# Patient Record
Sex: Female | Born: 1985 | Race: Black or African American | Hispanic: No | Smoking: Never smoker
Health system: Southern US, Community
[De-identification: ages and names within clinical notes are randomized; demographics above are authoritative.]

## PROBLEM LIST (undated history)

## (undated) DIAGNOSIS — D649 Anemia, unspecified: Secondary | ICD-10-CM

## (undated) DIAGNOSIS — I10 Essential (primary) hypertension: Secondary | ICD-10-CM

## (undated) DIAGNOSIS — O24419 Gestational diabetes mellitus in pregnancy, unspecified control: Secondary | ICD-10-CM

## (undated) HISTORY — DX: Anemia, unspecified: D64.9

## (undated) HISTORY — PX: NO PAST SURGERIES: SHX2092

---

## 2014-12-05 LAB — OB RESULTS CONSOLE ABO/RH: RH Type: POSITIVE

## 2014-12-05 LAB — OB RESULTS CONSOLE GC/CHLAMYDIA
CHLAMYDIA, DNA PROBE: NEGATIVE
GC PROBE AMP, GENITAL: NEGATIVE

## 2014-12-05 LAB — OB RESULTS CONSOLE RPR: RPR: NONREACTIVE

## 2014-12-05 LAB — SICKLE CELL SCREEN: Sickle Cell Screen: NORMAL

## 2014-12-05 LAB — OB RESULTS CONSOLE RUBELLA ANTIBODY, IGM: Rubella: IMMUNE

## 2014-12-05 LAB — OB RESULTS CONSOLE VARICELLA ZOSTER ANTIBODY, IGG: Varicella: IMMUNE

## 2014-12-05 LAB — OB RESULTS CONSOLE HGB/HCT, BLOOD
HCT: 36 %
HEMOGLOBIN: 11.7 g/dL

## 2014-12-05 LAB — OB RESULTS CONSOLE HEPATITIS B SURFACE ANTIGEN: HEP B S AG: NEGATIVE

## 2014-12-05 LAB — CULTURE, OB URINE: Urine Culture, OB: NEGATIVE

## 2014-12-05 LAB — CYTOLOGY - PAP: PAP SMEAR: NEGATIVE

## 2014-12-05 LAB — OB RESULTS CONSOLE HIV ANTIBODY (ROUTINE TESTING): HIV: NONREACTIVE

## 2014-12-05 LAB — OB RESULTS CONSOLE ANTIBODY SCREEN: ANTIBODY SCREEN: NEGATIVE

## 2014-12-09 NOTE — L&D Delivery Note (Cosign Needed)
Delivery Note Membranes were artificially ruptured at 0023 w/ pt at approx 6cm, then with the next couple of contractions at 12:28 AM a viable female was delivered via Vaginal, Spontaneous Delivery (Presentation: Left Occiput Anterior).  APGAR: 8, 9; weight 8 lb 14.8 oz (4048 g).   Placenta status: Intact, Spontaneous.  Cord: 3 vessels   Anesthesia: Local  Episiotomy: None Lacerations: 2nd degree;Perineal Suture Repair: 0.0 and 3.0 Vicryl Est. Blood Loss (mL): 100  Dr Macon LargeAnyanwu called in to assist w/ repair as was thought initially to be a partial 3rd. She completed the repair in full including adding support to the perineal body/capsule.  Mom to postpartum.  Baby to Couplet care / Skin to Skin.   Carly Cabrera, Carly Cabrera CNM 06/27/2015, 1:29 AM

## 2015-01-05 LAB — GLUCOSE TOLERANCE, 1 HOUR (50G) W/O FASTING: Glucose, GTT - 1 Hour: 172 mg/dL (ref ?–200)

## 2015-01-06 LAB — GLUCOSE TOLERANCE, 3 HOURS
GLUCOSE 1 HOUR GTT: 162 mg/dL (ref ?–200)
Glucose, GTT - 2 Hour: 165 mg/dL — AB (ref ?–140)
Glucose, GTT - 3 Hour: 122 mg/dL (ref ?–140)
Glucose, GTT - Fasting: 94 mg/dL (ref 80–110)

## 2015-03-31 LAB — GLUCOSE TOLERANCE, 1 HOUR (50G) W/O FASTING: Glucose, GTT - 1 Hour: 225 mg/dL — AB (ref ?–200)

## 2015-04-07 ENCOUNTER — Encounter: Payer: Self-pay | Admitting: *Deleted

## 2015-04-10 ENCOUNTER — Encounter: Payer: BLUE CROSS/BLUE SHIELD | Attending: Family Medicine | Admitting: *Deleted

## 2015-04-10 ENCOUNTER — Encounter: Payer: Self-pay | Admitting: Family Medicine

## 2015-04-10 ENCOUNTER — Ambulatory Visit (INDEPENDENT_AMBULATORY_CARE_PROVIDER_SITE_OTHER): Payer: BLUE CROSS/BLUE SHIELD | Admitting: Family Medicine

## 2015-04-10 VITALS — BP 114/82 | HR 101 | Temp 98.1°F | Ht 69.0 in | Wt 208.6 lb

## 2015-04-10 DIAGNOSIS — Z23 Encounter for immunization: Secondary | ICD-10-CM

## 2015-04-10 DIAGNOSIS — O24419 Gestational diabetes mellitus in pregnancy, unspecified control: Secondary | ICD-10-CM | POA: Insufficient documentation

## 2015-04-10 DIAGNOSIS — Z713 Dietary counseling and surveillance: Secondary | ICD-10-CM | POA: Insufficient documentation

## 2015-04-10 DIAGNOSIS — O0990 Supervision of high risk pregnancy, unspecified, unspecified trimester: Secondary | ICD-10-CM | POA: Insufficient documentation

## 2015-04-10 DIAGNOSIS — O0993 Supervision of high risk pregnancy, unspecified, third trimester: Secondary | ICD-10-CM

## 2015-04-10 LAB — COMPREHENSIVE METABOLIC PANEL
ALK PHOS: 80 U/L (ref 39–117)
AST: 8 U/L (ref 0–37)
Albumin: 3.2 g/dL — ABNORMAL LOW (ref 3.5–5.2)
BUN: 5 mg/dL — ABNORMAL LOW (ref 6–23)
CHLORIDE: 105 meq/L (ref 96–112)
CO2: 22 mEq/L (ref 19–32)
Calcium: 8.7 mg/dL (ref 8.4–10.5)
Creat: 0.52 mg/dL (ref 0.50–1.10)
Glucose, Bld: 141 mg/dL — ABNORMAL HIGH (ref 70–99)
Potassium: 4.2 mEq/L (ref 3.5–5.3)
Sodium: 138 mEq/L (ref 135–145)
TOTAL PROTEIN: 6.8 g/dL (ref 6.0–8.3)
Total Bilirubin: 0.3 mg/dL (ref 0.2–1.2)

## 2015-04-10 LAB — HEMOGLOBIN A1C
Hgb A1c MFr Bld: 6.4 % — ABNORMAL HIGH (ref <5.7)
Mean Plasma Glucose: 137 mg/dL — ABNORMAL HIGH (ref <117)

## 2015-04-10 LAB — POCT URINALYSIS DIP (DEVICE)
Bilirubin Urine: NEGATIVE
Glucose, UA: 100 mg/dL — AB
HGB URINE DIPSTICK: NEGATIVE
Ketones, ur: NEGATIVE mg/dL
NITRITE: NEGATIVE
PROTEIN: NEGATIVE mg/dL
Specific Gravity, Urine: >=1.03 (ref 1.005–1.030)
Urobilinogen, UA: 0.2 mg/dL (ref 0.0–1.0)
pH: 6 (ref 5.0–8.0)

## 2015-04-10 LAB — TSH: TSH: 0.545 u[IU]/mL (ref 0.350–4.500)

## 2015-04-10 MED ORDER — ACCU-CHEK FASTCLIX LANCETS MISC: 1.0000 | Freq: Four times a day (QID) | Status: DC

## 2015-04-10 MED ORDER — GLUCOSE BLOOD VI STRP: ORAL_STRIP | Status: DC

## 2015-04-10 MED ORDER — TETANUS-DIPHTH-ACELL PERTUSSIS 5-2.5-18.5 LF-MCG/0.5 IM SUSP
0.5000 mL | Freq: Once | INTRAMUSCULAR | Status: AC
  Administered 2015-04-10: 0.5 mL via INTRAMUSCULAR

## 2015-04-10 NOTE — Progress Notes (Signed)
Nutrition note: 1st visit consult & GDM diet education Pt is a newly diagnosed GDM pt. Pt has gained 16.6# @ 28w, which is slightly > expected. Pt reports eating 3 meals & 1-2 snacks/d. Pt is taking a PNV. Pt reports no N&V or heartburn. NKFA. Pt reports walking ~20 mins 1-2 x/wk. Pt received verbal & written education about GDM diet. Encouraged ~30 mins of walking daily. Discussed wt gain goals of 15-25# or 0.6#/wk. Pt agrees to follow GDM diet with 3 meals & 1-3 snacks/d with proper CHO/ protein combination. Pt has WIC & plans to BF. F/u in 2-4 wks Carly RevealLaura Joao Mccurdy, MS, RD, LDN, [facility]CLC

## 2015-04-10 NOTE — Progress Notes (Signed)
Small leuks on Udip 

## 2015-04-10 NOTE — Progress Notes (Signed)
  Patient was seen on 04/10/15 for Gestational Diabetes self-management . The following learning objectives were met by the patient :   States the definition of Gestational Diabetes  States when to check blood glucose levels  Demonstrates proper blood glucose monitoring techniques  States the effect of stress and exercise on blood glucose levels  States the importance of limiting caffeine and abstaining from alcohol and smoking  Plan:  Consider  increasing your activity level by walking daily as tolerated Begin checking BG before breakfast and 2 hours after first bit of breakfast, lunch and dinner after  as directed by MD  Take medication  as directed by MD Blood glucose monitor given: Accu-Check Nano Lot # [id number] Exp: 12/09/15 Blood glucose reading:  148 90 mins pp Biscuit[employer]  Patient instructed to monitor glucose levels: FBS: 60 - <90 2 hour: <120  Patient received the following handouts:  Nutrition Diabetes and Pregnancy  Carbohydrate Counting List  Meal Planning worksheet  Patient will be seen for follow-up as needed.

## 2015-04-10 NOTE — Progress Notes (Signed)
[facility]  Patient is 29 y.o. [id number] 28w0.  +FM, denies LOF, VB, contractions, vaginal discharge.  Overall feeling well.  ABO, Rh: B/Positive/-- (12/28 0000) Antibody: Negative (12/28 0000) Rubella:   RPR: Nonreactive (12/28 0000)  HBsAg: Negative (12/28 0000)  HIV: Non-reactive (12/28 0000)  GBS:    Early 1 hr Glucola 172 => 3h 94/162/165/122 Genetic screening: Quad  Hx of LGA: 4.5kg (9lb 14oz) baby  Today to see DM educator, nutritionist, discussed importance of diet and tight glycemic control during pregnancy and risks of uncontrolled diabetes to infant

## 2015-04-17 ENCOUNTER — Telehealth: Payer: Self-pay | Admitting: *Deleted

## 2015-04-17 MED ORDER — GLUCOSE BLOOD VI STRP: ORAL_STRIP | Status: DC

## 2015-04-17 NOTE — Addendum Note (Signed)
Addended by: Rosendo GrosHALPIN, Lillar Bianca L on: 04/17/2015 09:39 AM   Modules accepted: Orders

## 2015-04-24 ENCOUNTER — Ambulatory Visit (INDEPENDENT_AMBULATORY_CARE_PROVIDER_SITE_OTHER): Payer: BLUE CROSS/BLUE SHIELD | Admitting: Obstetrics & Gynecology

## 2015-04-24 VITALS — BP 113/73 | HR 87 | Wt 207.3 lb

## 2015-04-24 DIAGNOSIS — O24419 Gestational diabetes mellitus in pregnancy, unspecified control: Secondary | ICD-10-CM

## 2015-04-24 LAB — POCT URINALYSIS DIP (DEVICE)
Bilirubin Urine: NEGATIVE
GLUCOSE, UA: 250 mg/dL — AB
HGB URINE DIPSTICK: NEGATIVE
Ketones, ur: NEGATIVE mg/dL
NITRITE: NEGATIVE
PROTEIN: NEGATIVE mg/dL
UROBILINOGEN UA: 1 mg/dL (ref 0.0–1.0)
pH: 6 (ref 5.0–8.0)

## 2015-04-24 MED ORDER — GLYBURIDE 2.5 MG PO TABS: 2.5000 mg | ORAL_TABLET | Freq: Every day | ORAL | Status: DC

## 2015-04-24 NOTE — Patient Instructions (Signed)
Return to clinic for any obstetric concerns or go to [facility] for evaluation  

## 2015-04-24 NOTE — Progress Notes (Signed)
On review of blood sugars, fasting and PP breakfast are within control.  PP lunch 119-157, PP dinner 120-194 Will start Glyburide 2.5 mg po qam, reevaluate in one week. Will start 2x/week testing at 32 weeks, and delivery at 39 weeks. No other complaints or concerns.  Labor and fetal movement precautions reviewed.

## 2015-04-26 NOTE — Telephone Encounter (Signed)
Patient noted hat pharmacy did not have RX for test strips. Confirmed that pharmacy did not have the RX. Sent in again.

## 2015-05-01 ENCOUNTER — Ambulatory Visit (INDEPENDENT_AMBULATORY_CARE_PROVIDER_SITE_OTHER): Payer: BLUE CROSS/BLUE SHIELD | Admitting: Family Medicine

## 2015-05-01 VITALS — BP 123/76 | HR 94 | Temp 98.8°F | Wt 207.7 lb

## 2015-05-01 DIAGNOSIS — O24419 Gestational diabetes mellitus in pregnancy, unspecified control: Secondary | ICD-10-CM

## 2015-05-01 LAB — POCT URINALYSIS DIP (DEVICE)
Bilirubin Urine: NEGATIVE
Glucose, UA: NEGATIVE mg/dL
Hgb urine dipstick: NEGATIVE
Ketones, ur: NEGATIVE mg/dL
Nitrite: NEGATIVE
Protein, ur: 30 mg/dL — AB
Specific Gravity, Urine: 1.025 (ref 1.005–1.030)
Urobilinogen, UA: 1 mg/dL (ref 0.0–1.0)
pH: 7 (ref 5.0–8.0)

## 2015-05-01 MED ORDER — GLYBURIDE 2.5 MG PO TABS: 5.0000 mg | ORAL_TABLET | Freq: Every day | ORAL | Status: DC

## 2015-05-01 NOTE — Patient Instructions (Signed)

## 2015-05-01 NOTE — Progress Notes (Signed)
Has been taking glyburide after breakfast.  No problems with medication - no side effects.  Denies hypoglycemic episodes.   CBGs: a couple elevated fasting.  Many afternoon and evening CBGs in the 140s to 160s.  Increase glyburide to 5mg  in AM.   Has pain on moderate intermittent left low back.  Nonradiating.  Back exercises given.

## 2015-05-01 NOTE — Progress Notes (Signed)
Pt reports back pain. Large Leukocytes in urine.

## 2015-05-09 ENCOUNTER — Ambulatory Visit (INDEPENDENT_AMBULATORY_CARE_PROVIDER_SITE_OTHER): Payer: BLUE CROSS/BLUE SHIELD | Admitting: Obstetrics & Gynecology

## 2015-05-09 ENCOUNTER — Encounter: Payer: Self-pay | Admitting: Obstetrics & Gynecology

## 2015-05-09 VITALS — BP 105/67 | HR 94 | Wt 213.3 lb

## 2015-05-09 DIAGNOSIS — O24419 Gestational diabetes mellitus in pregnancy, unspecified control: Secondary | ICD-10-CM | POA: Diagnosis not present

## 2015-05-09 DIAGNOSIS — O0993 Supervision of high risk pregnancy, unspecified, third trimester: Secondary | ICD-10-CM

## 2015-05-09 LAB — POCT URINALYSIS DIP (DEVICE)
Bilirubin Urine: NEGATIVE
Glucose, UA: 250 mg/dL — AB
Hgb urine dipstick: NEGATIVE
Ketones, ur: NEGATIVE mg/dL
Nitrite: NEGATIVE
Protein, ur: 30 mg/dL — AB
Specific Gravity, Urine: >=1.03 (ref 1.005–1.030)
UROBILINOGEN UA: 2 mg/dL — AB (ref 0.0–1.0)
pH: 7 (ref 5.0–8.0)

## 2015-05-09 MED ORDER — GLYBURIDE 2.5 MG PO TABS: 5.0000 mg | ORAL_TABLET | Freq: Every day | ORAL | Status: DC

## 2015-05-09 NOTE — Progress Notes (Signed)
FBS 70-113, 3 values >range, pp 112-158, 176  Has increased to 5 mg glyburide in AM, will add 2.5 mg PM  NST reactive

## 2015-05-09 NOTE — Progress Notes (Signed)
Small leuks on udip.  Twice weekly fetal testing initiated today.

## 2015-05-09 NOTE — Patient Instructions (Signed)
Third Trimester of Pregnancy The third trimester is from week 29 through week 42, months 7 through 9. The third trimester is a time when the fetus is growing rapidly. At the end of the ninth month, the fetus is about 20 inches in length and weighs 6-10 pounds.  BODY CHANGES Your body goes through many changes during pregnancy. The changes vary from woman to woman.   Your weight will continue to increase. You can expect to gain 25-35 pounds (11-16 kg) by the end of the pregnancy.  You may begin to get stretch marks on your hips, abdomen, and breasts.  You may urinate more often because the fetus is moving lower into your pelvis and pressing on your bladder.  You may develop or continue to have heartburn as a result of your pregnancy.  You may develop constipation because certain hormones are causing the muscles that push waste through your intestines to slow down.  You may develop hemorrhoids or swollen, bulging veins (varicose veins).  You may have pelvic pain because of the weight gain and pregnancy hormones relaxing your joints between the bones in your pelvis. Backaches may result from overexertion of the muscles supporting your posture.  You may have changes in your hair. These can include thickening of your hair, rapid growth, and changes in texture. Some women also have hair loss during or after pregnancy, or hair that feels dry or thin. Your hair will most likely return to normal after your baby is born.  Your breasts will continue to grow and be tender. A yellow discharge may leak from your breasts called colostrum.  Your belly button may stick out.  You may feel short of breath because of your expanding uterus.  You may notice the fetus "dropping," or moving lower in your abdomen.  You may have a bloody mucus discharge. This usually occurs a few days to a week before labor begins.  Your cervix becomes thin and soft (effaced) near your due date. WHAT TO EXPECT AT YOUR PRENATAL  EXAMS  You will have prenatal exams every 2 weeks until week 36. Then, you will have weekly prenatal exams. During a routine prenatal visit:  You will be weighed to make sure you and the fetus are growing normally.  Your blood pressure is taken.  Your abdomen will be measured to track your baby's growth.  The fetal heartbeat will be listened to.  Any test results from the previous visit will be discussed.  You may have a cervical check near your due date to see if you have effaced. At around 36 weeks, your caregiver will check your cervix. At the same time, your caregiver will also perform a test on the secretions of the vaginal tissue. This test is to determine if a type of bacteria, Group B streptococcus, is present. Your caregiver will explain this further. Your caregiver may ask you:  What your birth plan is.  How you are feeling.  If you are feeling the baby move.  If you have had any abnormal symptoms, such as leaking fluid, bleeding, severe headaches, or abdominal cramping.  If you have any questions. Other tests or screenings that may be performed during your third trimester include:  Blood tests that check for low iron levels (anemia).  Fetal testing to check the health, activity level, and growth of the fetus. Testing is done if you have certain medical conditions or if there are problems during the pregnancy. FALSE LABOR You may feel small, irregular contractions that   eventually go away. These are called Braxton Hicks contractions, or false labor. Contractions may last for hours, days, or even weeks before true labor sets in. If contractions come at regular intervals, intensify, or become painful, it is best to be seen by your caregiver.  SIGNS OF LABOR   Menstrual-like cramps.  Contractions that are 5 minutes apart or less.  Contractions that start on the top of the uterus and spread down to the lower abdomen and back.  A sense of increased pelvic pressure or back  pain.  A watery or bloody mucus discharge that comes from the vagina. If you have any of these signs before the 37th week of pregnancy, call your caregiver right away. You need to go to the hospital to get checked immediately. HOME CARE INSTRUCTIONS   Avoid all smoking, herbs, alcohol, and unprescribed drugs. These chemicals affect the formation and growth of the baby.  Follow your caregiver's instructions regarding medicine use. There are medicines that are either safe or unsafe to take during pregnancy.  Exercise only as directed by your caregiver. Experiencing uterine cramps is a good sign to stop exercising.  Continue to eat regular, healthy meals.  Wear a good support bra for breast tenderness.  Do not use hot tubs, steam rooms, or saunas.  Wear your seat belt at all times when driving.  Avoid raw meat, uncooked cheese, cat litter boxes, and soil used by cats. These carry germs that can cause birth defects in the baby.  Take your prenatal vitamins.  Try taking a stool softener (if your caregiver approves) if you develop constipation. Eat more high-fiber foods, such as fresh vegetables or fruit and whole grains. Drink plenty of fluids to keep your urine clear or pale yellow.  Take warm sitz baths to soothe any pain or discomfort caused by hemorrhoids. Use hemorrhoid cream if your caregiver approves.  If you develop varicose veins, wear support hose. Elevate your feet for 15 minutes, 3-4 times a day. Limit salt in your diet.  Avoid heavy lifting, wear low heal shoes, and practice good posture.  Rest a lot with your legs elevated if you have leg cramps or low back pain.  Visit your dentist if you have not gone during your pregnancy. Use a soft toothbrush to brush your teeth and be gentle when you floss.  A sexual relationship may be continued unless your caregiver directs you otherwise.  Do not travel far distances unless it is absolutely necessary and only with the approval  of your caregiver.  Take prenatal classes to understand, practice, and ask questions about the labor and delivery.  Make a trial run to the hospital.  Pack your hospital bag.  Prepare the baby's nursery.  Continue to go to all your prenatal visits as directed by your caregiver. SEEK MEDICAL CARE IF:  You are unsure if you are in labor or if your water has broken.  You have dizziness.  You have mild pelvic cramps, pelvic pressure, or nagging pain in your abdominal area.  You have persistent nausea, vomiting, or diarrhea.  You have a bad smelling vaginal discharge.  You have pain with urination. SEEK IMMEDIATE MEDICAL CARE IF:   You have a fever.  You are leaking fluid from your vagina.  You have spotting or bleeding from your vagina.  You have severe abdominal cramping or pain.  You have rapid weight loss or gain.  You have shortness of breath with chest pain.  You notice sudden or extreme swelling   of your face, hands, ankles, feet, or legs.  You have not felt your baby move in over an hour.  You have severe headaches that do not go away with medicine.  You have vision changes. Document Released: 11/19/2001 Document Revised: 11/30/2013 Document Reviewed: 01/26/2013 Exit[employer] Patient Information 2015 [employer]. This information is not intended to replace advice given to you by your health care provider. Make sure you discuss any questions you have with your health care provider.  

## 2015-05-12 ENCOUNTER — Ambulatory Visit (INDEPENDENT_AMBULATORY_CARE_PROVIDER_SITE_OTHER): Payer: BLUE CROSS/BLUE SHIELD | Admitting: *Deleted

## 2015-05-12 VITALS — BP 104/64 | HR 90

## 2015-05-12 DIAGNOSIS — O24419 Gestational diabetes mellitus in pregnancy, unspecified control: Secondary | ICD-10-CM | POA: Diagnosis not present

## 2015-05-12 LAB — POCT URINALYSIS DIP (DEVICE)
Bilirubin Urine: NEGATIVE
GLUCOSE, UA: 500 mg/dL — AB
Hgb urine dipstick: NEGATIVE
LEUKOCYTES UA: NEGATIVE
Nitrite: NEGATIVE
Protein, ur: NEGATIVE mg/dL
Specific Gravity, Urine: 1.02 (ref 1.005–1.030)
Urobilinogen, UA: 0.2 mg/dL (ref 0.0–1.0)
pH: 6 (ref 5.0–8.0)

## 2015-05-12 NOTE — Progress Notes (Signed)
NST reactive.

## 2015-05-12 NOTE — Progress Notes (Signed)
Pt c/o itching and pain on inside of thighs.

## 2015-05-15 ENCOUNTER — Ambulatory Visit (INDEPENDENT_AMBULATORY_CARE_PROVIDER_SITE_OTHER): Payer: BLUE CROSS/BLUE SHIELD | Admitting: Family Medicine

## 2015-05-15 VITALS — BP 123/78 | HR 100 | Wt 211.8 lb

## 2015-05-15 DIAGNOSIS — O24419 Gestational diabetes mellitus in pregnancy, unspecified control: Secondary | ICD-10-CM

## 2015-05-15 DIAGNOSIS — O0993 Supervision of high risk pregnancy, unspecified, third trimester: Secondary | ICD-10-CM

## 2015-05-15 LAB — POCT URINALYSIS DIP (DEVICE)
Bilirubin Urine: NEGATIVE
Glucose, UA: 500 mg/dL — AB
NITRITE: NEGATIVE
Protein, ur: NEGATIVE mg/dL
Specific Gravity, Urine: 1.025 (ref 1.005–1.030)
Urobilinogen, UA: 0.2 mg/dL (ref 0.0–1.0)
pH: 5.5 (ref 5.0–8.0)

## 2015-05-15 MED ORDER — GLYBURIDE 5 MG PO TABS: 5.0000 mg | ORAL_TABLET | Freq: Every day | ORAL | Status: DC

## 2015-05-15 MED ORDER — NYSTATIN 100000 UNIT/GM EX CREA
1.0000 | TOPICAL_CREAM | Freq: Two times a day (BID) | CUTANEOUS | Status: DC
Start: 2015-05-15 — End: 2015-05-29

## 2015-05-15 NOTE — Progress Notes (Signed)
Pt reports rash on thighs and groin, very itchy.

## 2015-05-15 NOTE — Patient Instructions (Signed)
Third Trimester of Pregnancy The third trimester is from week 29 through week 42, months 7 through 9. The third trimester is a time when the fetus is growing rapidly. At the end of the ninth month, the fetus is about 20 inches in length and weighs 6-10 pounds.  BODY CHANGES Your body goes through many changes during pregnancy. The changes vary from woman to woman.   Your weight will continue to increase. You can expect to gain 25-35 pounds (11-16 kg) by the end of the pregnancy.  You may begin to get stretch marks on your hips, abdomen, and breasts.  You may urinate more often because the fetus is moving lower into your pelvis and pressing on your bladder.  You may develop or continue to have heartburn as a result of your pregnancy.  You may develop constipation because certain hormones are causing the muscles that push waste through your intestines to slow down.  You may develop hemorrhoids or swollen, bulging veins (varicose veins).  You may have pelvic pain because of the weight gain and pregnancy hormones relaxing your joints between the bones in your pelvis. Backaches may result from overexertion of the muscles supporting your posture.  You may have changes in your hair. These can include thickening of your hair, rapid growth, and changes in texture. Some women also have hair loss during or after pregnancy, or hair that feels dry or thin. Your hair will most likely return to normal after your baby is born.  Your breasts will continue to grow and be tender. A yellow discharge may leak from your breasts called colostrum.  Your belly button may stick out.  You may feel short of breath because of your expanding uterus.  You may notice the fetus "dropping," or moving lower in your abdomen.  You may have a bloody mucus discharge. This usually occurs a few days to a week before labor begins.  Your cervix becomes thin and soft (effaced) near your due date. WHAT TO EXPECT AT YOUR PRENATAL  EXAMS  You will have prenatal exams every 2 weeks until week 36. Then, you will have weekly prenatal exams. During a routine prenatal visit:  You will be weighed to make sure you and the fetus are growing normally.  Your blood pressure is taken.  Your abdomen will be measured to track your baby's growth.  The fetal heartbeat will be listened to.  Any test results from the previous visit will be discussed.  You may have a cervical check near your due date to see if you have effaced. At around 36 weeks, your caregiver will check your cervix. At the same time, your caregiver will also perform a test on the secretions of the vaginal tissue. This test is to determine if a type of bacteria, Group B streptococcus, is present. Your caregiver will explain this further. Your caregiver may ask you:  What your birth plan is.  How you are feeling.  If you are feeling the baby move.  If you have had any abnormal symptoms, such as leaking fluid, bleeding, severe headaches, or abdominal cramping.  If you have any questions. Other tests or screenings that may be performed during your third trimester include:  Blood tests that check for low iron levels (anemia).  Fetal testing to check the health, activity level, and growth of the fetus. Testing is done if you have certain medical conditions or if there are problems during the pregnancy. FALSE LABOR You may feel small, irregular contractions that   eventually go away. These are called Braxton Hicks contractions, or false labor. Contractions may last for hours, days, or even weeks before true labor sets in. If contractions come at regular intervals, intensify, or become painful, it is best to be seen by your caregiver.  SIGNS OF LABOR   Menstrual-like cramps.  Contractions that are 5 minutes apart or less.  Contractions that start on the top of the uterus and spread down to the lower abdomen and back.  A sense of increased pelvic pressure or back  pain.  A watery or bloody mucus discharge that comes from the vagina. If you have any of these signs before the 37th week of pregnancy, call your caregiver right away. You need to go to the hospital to get checked immediately. HOME CARE INSTRUCTIONS   Avoid all smoking, herbs, alcohol, and unprescribed drugs. These chemicals affect the formation and growth of the baby.  Follow your caregiver's instructions regarding medicine use. There are medicines that are either safe or unsafe to take during pregnancy.  Exercise only as directed by your caregiver. Experiencing uterine cramps is a good sign to stop exercising.  Continue to eat regular, healthy meals.  Wear a good support bra for breast tenderness.  Do not use hot tubs, steam rooms, or saunas.  Wear your seat belt at all times when driving.  Avoid raw meat, uncooked cheese, cat litter boxes, and soil used by cats. These carry germs that can cause birth defects in the baby.  Take your prenatal vitamins.  Try taking a stool softener (if your caregiver approves) if you develop constipation. Eat more high-fiber foods, such as fresh vegetables or fruit and whole grains. Drink plenty of fluids to keep your urine clear or pale yellow.  Take warm sitz baths to soothe any pain or discomfort caused by hemorrhoids. Use hemorrhoid cream if your caregiver approves.  If you develop varicose veins, wear support hose. Elevate your feet for 15 minutes, 3-4 times a day. Limit salt in your diet.  Avoid heavy lifting, wear low heal shoes, and practice good posture.  Rest a lot with your legs elevated if you have leg cramps or low back pain.  Visit your dentist if you have not gone during your pregnancy. Use a soft toothbrush to brush your teeth and be gentle when you floss.  A sexual relationship may be continued unless your caregiver directs you otherwise.  Do not travel far distances unless it is absolutely necessary and only with the approval  of your caregiver.  Take prenatal classes to understand, practice, and ask questions about the labor and delivery.  Make a trial run to the hospital.  Pack your hospital bag.  Prepare the baby's nursery.  Continue to go to all your prenatal visits as directed by your caregiver. SEEK MEDICAL CARE IF:  You are unsure if you are in labor or if your water has broken.  You have dizziness.  You have mild pelvic cramps, pelvic pressure, or nagging pain in your abdominal area.  You have persistent nausea, vomiting, or diarrhea.  You have a bad smelling vaginal discharge.  You have pain with urination. SEEK IMMEDIATE MEDICAL CARE IF:   You have a fever.  You are leaking fluid from your vagina.  You have spotting or bleeding from your vagina.  You have severe abdominal cramping or pain.  You have rapid weight loss or gain.  You have shortness of breath with chest pain.  You notice sudden or extreme swelling   of your face, hands, ankles, feet, or legs.  You have not felt your baby move in over an hour.  You have severe headaches that do not go away with medicine.  You have vision changes. Document Released: 11/19/2001 Document Revised: 11/30/2013 Document Reviewed: 01/26/2013 Exit[employer] Patient Information 2015 [employer]. This information is not intended to replace advice given to you by your health care provider. Make sure you discuss any questions you have with your health care provider.  

## 2015-05-15 NOTE — Progress Notes (Signed)
Subjective:   Carly Cabrera is a 29 y.o. [phone] at 151w0d being seen today for her obstetrical visit.  Patient reports rash.   Contractions: Contractions: Not present.   Vaginal Bleeding Vag. Bleeding: None.   Fetal Movement: Movement: Present.  Reports good fetal movement.  Blood sugars: Fasting 85-141, 6/7 elevated.  2hr PP: 78-176, 14/21 elevated. Has been taking Glyburide 5mg  in AM and 2.5mg  in PM. No hypoglycemic episodes  Worsening rash on inner thighs - painful and itching.  No palliating or provoking factors.  Hasn't tried to apply anything.  The following portions of the patient's history were reviewed and updated as appropriate: allergies, current medications, past family history, past medical history, past social history, past surgical history and problem list.   Objective:  BP 123/78 mmHg  Pulse 100  Wt 211 lb 12.8 oz (96.072 kg)  LMP 09/26/2014 (Exact Date) Fetal Heart Rate: Fetal Heart Rate (bpm): 148  Fundal Height:    Fetal Movement: Movement: Present  Fetal Presentation:  vertex  Skin: erythemic, raised intertriginous rash on inner thighs and extending to vulva bilaterally. Abdomen: Soft, gravid, appropriate for gestational age.  Pain/Pressure: Pain/Pressure: Present     Extremities: Edema: Edema: None   Urinalysis: Protein:   Glucose:   Results for orders placed or performed in visit on 05/15/15 (from the past 24 hour(s))  POCT urinalysis dip (device)     Status: Abnormal   Collection Time: 05/15/15  9:00 AM  Result Value Ref Range   Glucose, UA 500 (A) NEGATIVE mg/dL   Bilirubin Urine NEGATIVE NEGATIVE   Ketones, ur TRACE (A) NEGATIVE mg/dL   Specific Gravity, Urine 1.025 1.005 - 1.030   Hgb urine dipstick TRACE (A) NEGATIVE   pH 5.5 5.0 - 8.0   Protein, ur NEGATIVE NEGATIVE mg/dL   Urobilinogen, UA 0.2 0.0 - 1.0 mg/dL   Nitrite NEGATIVE NEGATIVE   Leukocytes, UA SMALL (A) NEGATIVE     Assessment and Plan:   Pregnancy:  G4P3003 at 3551w0d  1.  Supervision of high risk pregnancy, antepartum, third trimester Normal fundal height.   2. A2 Gestational diabetes mellitus, antepartum Continue NST. NST reactive today. Continue CBGs 4x daily Not controlled yet. Increase glyburide to 10mg  in AM and 5mg  in PM.  3.  Intertrigo Candidal.  Nystatin cream prescribed  Preterm labor symptoms: vaginal bleeding, contractions and leaking of fluid reviewed in detail.  Fetal movement precautions reviewed.    Levie HeritageJacob J Stinson, DO

## 2015-05-19 ENCOUNTER — Ambulatory Visit (INDEPENDENT_AMBULATORY_CARE_PROVIDER_SITE_OTHER): Payer: BLUE CROSS/BLUE SHIELD | Admitting: *Deleted

## 2015-05-19 VITALS — BP 105/73 | HR 119

## 2015-05-19 DIAGNOSIS — O24419 Gestational diabetes mellitus in pregnancy, unspecified control: Secondary | ICD-10-CM

## 2015-05-22 ENCOUNTER — Ambulatory Visit (INDEPENDENT_AMBULATORY_CARE_PROVIDER_SITE_OTHER): Payer: BLUE CROSS/BLUE SHIELD | Admitting: Obstetrics & Gynecology

## 2015-05-22 VITALS — BP 117/72 | HR 88 | Temp 97.6°F | Wt 212.9 lb

## 2015-05-22 DIAGNOSIS — O0993 Supervision of high risk pregnancy, unspecified, third trimester: Secondary | ICD-10-CM

## 2015-05-22 DIAGNOSIS — O24419 Gestational diabetes mellitus in pregnancy, unspecified control: Secondary | ICD-10-CM

## 2015-05-22 MED ORDER — GLYBURIDE 5 MG PO TABS: ORAL_TABLET | ORAL | Status: DC

## 2015-05-22 NOTE — Progress Notes (Signed)
Subjective:  Carly Cabrera is a 29 y.o. [phone] at [redacted]w[redacted]d being seen today for ongoing prenatal care.  Patient reports no complaints.  Contractions: Not present.  Vag. Bleeding: None. Movement: Present. Denies leaking of fluid.   The following portions of the patient's history were reviewed and updated as appropriate: allergies, current medications, past family history, past medical history, past social history, past surgical history and problem list.   Objective:   Filed Vitals:   05/22/15 1022  BP: 117/72  Pulse: 88  Temp: 97.6 F (36.4 C)  Weight: 212 lb 14.4 oz (96.571 kg)    Fetal Status: Fetal Heart Rate (bpm): 143 Fundal Height: 35 cm Movement: Present     General:  Alert, oriented and cooperative. Patient is in no acute distress.  Skin: Skin is warm and dry. No rash noted.   Cardiovascular: Normal heart rate noted  Respiratory: Effort and breath sounds normal, no problems with respiration noted  Abdomen: Soft, gravid, appropriate for gestational age. Pain/Pressure: Present     Vaginal: Vag. Bleeding: None.       Cervix: Not evaluated  Extremities: Normal range of motion.  Edema: None  Mental Status: Normal mood and affect. Normal behavior. Normal judgment and thought content.   Urinalysis:      Blood sugars:  Fasting 105/93/105/74/124/74/100/74.  All 2 hr PP are within normal range  Assessment and Plan:  Pregnancy: G4P3003 at 101w0d  1. A2 Gestational diabetes mellitus, antepartum Changed GlyBURIDE (DIABETA) 5 MG tablet; 2 tablets in AM and 1.5 tablet in QHS (was one tablet before). Will reevaluate later this week, may consider addition of Metformin if needed. NST performed today was reviewed and was found to be reactive.  Continue recommended antenatal testing and prenatal care.  2. Supervision of high risk pregnancy, antepartum, third trimester Preterm labor symptoms and general obstetric precautions including but not limited to vaginal bleeding, contractions,  leaking of fluid and fetal movement were reviewed in detail with the patient. Please refer to After Visit Summary for other counseling recommendations.   No Follow-up on file.   Tereso Newcomer, MD

## 2015-05-22 NOTE — Patient Instructions (Signed)
Return to clinic for any obstetric concerns or go to [facility] for evaluation  

## 2015-05-22 NOTE — Progress Notes (Signed)
Discussed breastfeeding tip of the week.  Has not given urine sample yet.

## 2015-05-25 ENCOUNTER — Ambulatory Visit (INDEPENDENT_AMBULATORY_CARE_PROVIDER_SITE_OTHER): Payer: BLUE CROSS/BLUE SHIELD | Admitting: *Deleted

## 2015-05-25 ENCOUNTER — Ambulatory Visit (HOSPITAL_COMMUNITY)
Admission: RE | Admit: 2015-05-25 | Discharge: 2015-05-25 | Disposition: A | Discharge status: Home / Self Care | Payer: BLUE CROSS/BLUE SHIELD | Source: Ambulatory Visit | Attending: Obstetrics & Gynecology | Admitting: Obstetrics & Gynecology

## 2015-05-25 VITALS — BP 106/68 | HR 89

## 2015-05-25 DIAGNOSIS — O24419 Gestational diabetes mellitus in pregnancy, unspecified control: Secondary | ICD-10-CM | POA: Insufficient documentation

## 2015-05-25 DIAGNOSIS — O283 Abnormal ultrasonic finding on antenatal screening of mother: Secondary | ICD-10-CM | POA: Diagnosis present

## 2015-05-25 DIAGNOSIS — Z79899 Other long term (current) drug therapy: Secondary | ICD-10-CM | POA: Diagnosis not present

## 2015-05-25 DIAGNOSIS — Z3A34 34 weeks gestation of pregnancy: Secondary | ICD-10-CM | POA: Insufficient documentation

## 2015-05-25 NOTE — Progress Notes (Signed)
NST performed today was reviewed and was found to be reactive.  Continue recommended antenatal testing and prenatal care.  

## 2015-05-25 NOTE — Progress Notes (Signed)
BPP today due to NR NST - Dr. Macon Large notified.

## 2015-05-29 ENCOUNTER — Ambulatory Visit (INDEPENDENT_AMBULATORY_CARE_PROVIDER_SITE_OTHER): Payer: BLUE CROSS/BLUE SHIELD | Admitting: Obstetrics & Gynecology

## 2015-05-29 VITALS — BP 106/63 | HR 96 | Wt 216.8 lb

## 2015-05-29 DIAGNOSIS — O24419 Gestational diabetes mellitus in pregnancy, unspecified control: Secondary | ICD-10-CM | POA: Diagnosis not present

## 2015-05-29 LAB — POCT URINALYSIS DIP (DEVICE)
Bilirubin Urine: NEGATIVE
Glucose, UA: >=1000 mg/dL — AB
Hgb urine dipstick: NEGATIVE
Nitrite: NEGATIVE
PH: 6 (ref 5.0–8.0)
PROTEIN: NEGATIVE mg/dL
Specific Gravity, Urine: 1.02 (ref 1.005–1.030)
UROBILINOGEN UA: 0.2 mg/dL (ref 0.0–1.0)

## 2015-05-29 NOTE — Progress Notes (Signed)
Subjective: NST today  Carly Cabrera is a 29 y.o. [phone] at [redacted]w[redacted]d being seen today for ongoing prenatal care.  Patient reports no complaints.  Contractions: Not present.  Vag. Bleeding: None. Movement: Present. Denies leaking of fluid.  BPP 8/8 last week The following portions of the patient's history were reviewed and updated as appropriate: allergies, current medications, past family history, past medical history, past social history, past surgical history and problem list.   Objective:NAD   Filed Vitals:   05/29/15 0802  BP: 106/63  Pulse: 96  Weight: 216 lb 12.8 oz (98.34 kg)    Fetal Status: Fetal Heart Rate (bpm): NST Fundal Height: 36 cm Movement: Present     General:  Alert, oriented and cooperative. Patient is in no acute distress.  Skin: Skin is warm and dry. No rash noted.   Cardiovascular: Normal heart rate noted  Respiratory:  no problems with respiration noted  Abdomen: Soft, gravid, appropriate for gestational age. Pain/Pressure: Absent     Vaginal: Vag. Bleeding: None.       Cervix: Not evaluated       Extremities: Normal range of motion.  Edema: None  Mental Status: Normal mood and affect. Normal behavior. Normal judgment and thought content.   Urinalysis: Urine Protein: Negative Urine Glucose: 4+  Assessment and Plan:  Pregnancy: G4P3003 at [redacted]w[redacted]d  1. Gestational diabetes mellitus in third trimester, unspecified diabetic control FBS most in range, 102 highest, PP in range - Fetal nonstress test  2. A2 Gestational diabetes mellitus, antepartum Acceptable control   Preterm labor symptoms and general obstetric precautions including but not limited to vaginal bleeding, contractions, leaking of fluid and fetal movement were reviewed in detail with the patient.  Please refer to After Visit Summary for other counseling recommendations.   Return in about 7 days (around 06/05/2015) for Ob f/u & NST.   Adam Phenix, MD 05/29/2015

## 2015-05-29 NOTE — Progress Notes (Signed)
Reviewed tip of week with patient  

## 2015-05-31 ENCOUNTER — Ambulatory Visit (HOSPITAL_COMMUNITY): Payer: BLUE CROSS/BLUE SHIELD

## 2015-06-01 ENCOUNTER — Other Ambulatory Visit: Payer: Self-pay | Admitting: Obstetrics & Gynecology

## 2015-06-01 ENCOUNTER — Ambulatory Visit (INDEPENDENT_AMBULATORY_CARE_PROVIDER_SITE_OTHER): Payer: BLUE CROSS/BLUE SHIELD | Admitting: General Practice

## 2015-06-01 ENCOUNTER — Ambulatory Visit (HOSPITAL_COMMUNITY)
Admission: RE | Admit: 2015-06-01 | Discharge: 2015-06-01 | Disposition: A | Discharge status: Home / Self Care | Payer: BLUE CROSS/BLUE SHIELD | Source: Ambulatory Visit | Attending: Family Medicine | Admitting: Family Medicine

## 2015-06-01 ENCOUNTER — Encounter (HOSPITAL_COMMUNITY): Payer: Self-pay

## 2015-06-01 VITALS — BP 115/68 | HR 116

## 2015-06-01 DIAGNOSIS — O24414 Gestational diabetes mellitus in pregnancy, insulin controlled: Secondary | ICD-10-CM | POA: Diagnosis present

## 2015-06-01 DIAGNOSIS — O24419 Gestational diabetes mellitus in pregnancy, unspecified control: Secondary | ICD-10-CM | POA: Diagnosis not present

## 2015-06-01 DIAGNOSIS — Z3689 Encounter for other specified antenatal screening: Secondary | ICD-10-CM | POA: Insufficient documentation

## 2015-06-01 DIAGNOSIS — Z3A35 35 weeks gestation of pregnancy: Secondary | ICD-10-CM | POA: Diagnosis not present

## 2015-06-01 DIAGNOSIS — O99213 Obesity complicating pregnancy, third trimester: Secondary | ICD-10-CM | POA: Insufficient documentation

## 2015-06-02 NOTE — Progress Notes (Signed)
Pt with NST that is reassuring.  Cat I tracing.   S/p sono after NST which has normal AFI   Carly Cabrera, M.D., Evern Core

## 2015-06-05 ENCOUNTER — Other Ambulatory Visit: Payer: Self-pay | Admitting: Obstetrics and Gynecology

## 2015-06-05 ENCOUNTER — Ambulatory Visit (INDEPENDENT_AMBULATORY_CARE_PROVIDER_SITE_OTHER): Payer: BLUE CROSS/BLUE SHIELD | Admitting: Obstetrics and Gynecology

## 2015-06-05 VITALS — BP 122/80 | HR 93 | Temp 98.6°F | Wt 217.5 lb

## 2015-06-05 DIAGNOSIS — O24419 Gestational diabetes mellitus in pregnancy, unspecified control: Secondary | ICD-10-CM | POA: Diagnosis not present

## 2015-06-05 LAB — POCT URINALYSIS DIP (DEVICE)
BILIRUBIN URINE: NEGATIVE
Glucose, UA: NEGATIVE mg/dL
KETONES UR: NEGATIVE mg/dL
Nitrite: NEGATIVE
PH: 6.5 (ref 5.0–8.0)
Protein, ur: NEGATIVE mg/dL
Specific Gravity, Urine: 1.02 (ref 1.005–1.030)
Urobilinogen, UA: 0.2 mg/dL (ref 0.0–1.0)

## 2015-06-05 LAB — OB RESULTS CONSOLE GBS: STREP GROUP B AG: NEGATIVE

## 2015-06-05 NOTE — Progress Notes (Signed)
Needs nst

## 2015-06-05 NOTE — Addendum Note (Signed)
Addended by: Jill Side on: 06/05/2015 09:40 AM   Modules accepted: Orders

## 2015-06-05 NOTE — Patient Instructions (Signed)
Contraception Choices Contraception (birth control) is the use of any methods or devices to prevent pregnancy. Below are some methods to help avoid pregnancy. HORMONAL METHODS   Contraceptive implant. This is a thin, plastic tube containing progesterone hormone. It does not contain estrogen hormone. Your health care provider inserts the tube in the inner part of the upper arm. The tube can remain in place for up to 3 years. After 3 years, the implant must be removed. The implant prevents the ovaries from releasing an egg (ovulation), thickens the cervical mucus to prevent sperm from entering the uterus, and thins the lining of the inside of the uterus.  Progesterone-only injections. These injections are given every 3 months by your health care provider to prevent pregnancy. This synthetic progesterone hormone stops the ovaries from releasing eggs. It also thickens cervical mucus and changes the uterine lining. This makes it harder for sperm to survive in the uterus.  Birth control pills. These pills contain estrogen and progesterone hormone. They work by preventing the ovaries from releasing eggs (ovulation). They also cause the cervical mucus to thicken, preventing the sperm from entering the uterus. Birth control pills are prescribed by a health care provider.Birth control pills can also be used to treat heavy periods.  Minipill. This type of birth control pill contains only the progesterone hormone. They are taken every day of each month and must be prescribed by your health care provider.  Birth control patch. The patch contains hormones similar to those in birth control pills. It must be changed once a week and is prescribed by a health care provider.  Vaginal ring. The ring contains hormones similar to those in birth control pills. It is left in the vagina for 3 weeks, removed for 1 week, and then a new one is put back in place. The patient must be comfortable inserting and removing the ring  from the vagina.A health care provider's prescription is necessary.  Emergency contraception. Emergency contraceptives prevent pregnancy after unprotected sexual intercourse. This pill can be taken right after sex or up to 5 days after unprotected sex. It is most effective the sooner you take the pills after having sexual intercourse. Most emergency contraceptive pills are available without a prescription. Check with your pharmacist. Do not use emergency contraception as your only form of birth control. BARRIER METHODS   Female condom. This is a thin sheath (latex or rubber) that is worn over the penis during sexual intercourse. It can be used with spermicide to increase effectiveness.  Female condom. This is a soft, loose-fitting sheath that is put into the vagina before sexual intercourse.  Diaphragm. This is a soft, latex, dome-shaped barrier that must be fitted by a health care provider. It is inserted into the vagina, along with a spermicidal jelly. It is inserted before intercourse. The diaphragm should be left in the vagina for 6 to 8 hours after intercourse.  Cervical cap. This is a round, soft, latex or plastic cup that fits over the cervix and must be fitted by a health care provider. The cap can be left in place for up to 48 hours after intercourse.  Sponge. This is a soft, circular piece of polyurethane foam. The sponge has spermicide in it. It is inserted into the vagina after wetting it and before sexual intercourse.  Spermicides. These are chemicals that kill or block sperm from entering the cervix and uterus. They come in the form of creams, jellies, suppositories, foam, or tablets. They do not require a   prescription. They are inserted into the vagina with an applicator before having sexual intercourse. The process must be repeated every time you have sexual intercourse. INTRAUTERINE CONTRACEPTION  Intrauterine device (IUD). This is a T-shaped device that is put in a woman's uterus  during a menstrual period to prevent pregnancy. There are 2 types:  Copper IUD. This type of IUD is wrapped in copper wire and is placed inside the uterus. Copper makes the uterus and fallopian tubes produce a fluid that kills sperm. It can stay in place for 10 years.  Hormone IUD. This type of IUD contains the hormone progestin (synthetic progesterone). The hormone thickens the cervical mucus and prevents sperm from entering the uterus, and it also thins the uterine lining to prevent implantation of a fertilized egg. The hormone can weaken or kill the sperm that get into the uterus. It can stay in place for 3-5 years, depending on which type of IUD is used. PERMANENT METHODS OF CONTRACEPTION  Female tubal ligation. This is when the woman's fallopian tubes are surgically sealed, tied, or blocked to prevent the egg from traveling to the uterus.  Hysteroscopic sterilization. This involves placing a small coil or insert into each fallopian tube. Your doctor uses a technique called hysteroscopy to do the procedure. The device causes scar tissue to form. This results in permanent blockage of the fallopian tubes, so the sperm cannot fertilize the egg. It takes about 3 months after the procedure for the tubes to become blocked. You must use another form of birth control for these 3 months.  Female sterilization. This is when the female has the tubes that carry sperm tied off (vasectomy).This blocks sperm from entering the vagina during sexual intercourse. After the procedure, the man can still ejaculate fluid (semen). NATURAL PLANNING METHODS  Natural family planning. This is not having sexual intercourse or using a barrier method (condom, diaphragm, cervical cap) on days the woman could become pregnant.  Calendar method. This is keeping track of the length of each menstrual cycle and identifying when you are fertile.  Ovulation method. This is avoiding sexual intercourse during ovulation.  Symptothermal  method. This is avoiding sexual intercourse during ovulation, using a thermometer and ovulation symptoms.  Post-ovulation method. This is timing sexual intercourse after you have ovulated. Regardless of which type or method of contraception you choose, it is important that you use condoms to protect against the transmission of sexually transmitted infections (STIs). Talk with your health care provider about which form of contraception is most appropriate for you. Document Released: 11/25/2005 Document Revised: 11/30/2013 Document Reviewed: 05/20/2013 [employer] Patient Information 2015 [employer]. This information is not intended to replace advice given to you by your health care provider. Make sure you discuss any questions you have with your health care provider.  

## 2015-06-05 NOTE — Progress Notes (Signed)
Subjective:  Carly Cabrera is a 29 y.o. [phone] at 327w0d being seen today for ongoing prenatal care.  Patient reports backache and occasional contractions.  Contractions: Irregular.  Vag. Bleeding: None. Movement: Present. Denies leaking of fluid.   AM: 103, 90, 85, 69, 95, 90, 92, 69 1 hr pp - 67 - 98, all at goal   No complaints but occasional pain, relieve by rest.   The following portions of the patient's history were reviewed and updated as appropriate: allergies, current medications, past family history, past medical history, past social history, past surgical history and problem list.   Objective:   Filed Vitals:   06/05/15 0805  BP: 122/80  Pulse: 93  Temp: 98.6 F (37 C)  Weight: 217 lb 8 oz (98.657 kg)    Fetal Status: Fetal Heart Rate (bpm): 138   Movement: Present     General:  Alert, oriented and cooperative. Patient is in no acute distress.  Skin: Skin is warm and dry. No rash noted.   Cardiovascular: Normal heart rate noted  Respiratory: Normal respiratory effort, no problems with respiration noted  Abdomen: Soft, gravid, appropriate for gestational age. Pain/Pressure: Present     Vaginal: Vag. Bleeding: None.       Cervix: Exam revealed Dilation: Closed Effacement (%): Thick Station: -3  Extremities: Normal range of motion.  Edema: Trace  Mental Status: Normal mood and affect. Normal behavior. Normal judgment and thought content.   Urinalysis:      Assessment and Plan:  Pregnancy: G4P3003 at [redacted]w[redacted]d  1. Gestational diabetes mellitus, antepartum Well controlled. Continue glyburide as rx'd. NST today IOL at 39 wks. - Culture, beta strep (group b only) - GC/Chlamydia Probe Amp   Term labor symptoms and general obstetric precautions including but not limited to vaginal bleeding, contractions, leaking of fluid and fetal movement were reviewed in detail with the patient.  Please refer to After Visit Summary for other counseling recommendations.   Return in  about 3 days (around 06/08/2015) for NST, HROB.   Ethelda Chickaroline Johann Gascoigne, MD

## 2015-06-06 LAB — GC/CHLAMYDIA PROBE AMP
CT Probe RNA: NEGATIVE
GC Probe RNA: NEGATIVE

## 2015-06-07 LAB — CULTURE, BETA STREP (GROUP B ONLY)

## 2015-06-09 ENCOUNTER — Ambulatory Visit (INDEPENDENT_AMBULATORY_CARE_PROVIDER_SITE_OTHER): Payer: BLUE CROSS/BLUE SHIELD | Admitting: *Deleted

## 2015-06-09 ENCOUNTER — Ambulatory Visit (HOSPITAL_COMMUNITY)
Admission: RE | Admit: 2015-06-09 | Discharge: 2015-06-09 | Disposition: A | Discharge status: Home / Self Care | Payer: BLUE CROSS/BLUE SHIELD | Source: Ambulatory Visit | Attending: Obstetrics and Gynecology | Admitting: Obstetrics and Gynecology

## 2015-06-09 VITALS — BP 101/67 | HR 106

## 2015-06-09 DIAGNOSIS — O321XX0 Maternal care for breech presentation, not applicable or unspecified: Secondary | ICD-10-CM | POA: Insufficient documentation

## 2015-06-09 DIAGNOSIS — O24414 Gestational diabetes mellitus in pregnancy, insulin controlled: Secondary | ICD-10-CM | POA: Diagnosis not present

## 2015-06-09 DIAGNOSIS — Z3A36 36 weeks gestation of pregnancy: Secondary | ICD-10-CM | POA: Diagnosis not present

## 2015-06-09 DIAGNOSIS — O24419 Gestational diabetes mellitus in pregnancy, unspecified control: Secondary | ICD-10-CM

## 2015-06-09 NOTE — Progress Notes (Signed)
NST reactive.

## 2015-06-09 NOTE — Progress Notes (Signed)
BPP today, next appt for NST/AFI in 1 week

## 2015-06-16 ENCOUNTER — Ambulatory Visit (INDEPENDENT_AMBULATORY_CARE_PROVIDER_SITE_OTHER): Payer: BLUE CROSS/BLUE SHIELD | Admitting: *Deleted

## 2015-06-16 VITALS — BP 115/74 | HR 106

## 2015-06-16 DIAGNOSIS — O24414 Gestational diabetes mellitus in pregnancy, insulin controlled: Secondary | ICD-10-CM

## 2015-06-16 NOTE — Progress Notes (Signed)
7/8 NST reviewed and reactive 

## 2015-06-16 NOTE — Progress Notes (Signed)
IOL scheduled 7/18 @ 0630

## 2015-06-19 ENCOUNTER — Ambulatory Visit (INDEPENDENT_AMBULATORY_CARE_PROVIDER_SITE_OTHER): Payer: BLUE CROSS/BLUE SHIELD | Admitting: Family Medicine

## 2015-06-19 VITALS — BP 109/73 | HR 89 | Wt 228.1 lb

## 2015-06-19 DIAGNOSIS — O24419 Gestational diabetes mellitus in pregnancy, unspecified control: Secondary | ICD-10-CM

## 2015-06-19 DIAGNOSIS — O09893 Supervision of other high risk pregnancies, third trimester: Secondary | ICD-10-CM

## 2015-06-19 DIAGNOSIS — O0993 Supervision of high risk pregnancy, unspecified, third trimester: Secondary | ICD-10-CM

## 2015-06-19 LAB — POCT URINALYSIS DIP (DEVICE)
BILIRUBIN URINE: NEGATIVE
Glucose, UA: 500 mg/dL — AB
Hgb urine dipstick: NEGATIVE
KETONES UR: NEGATIVE mg/dL
NITRITE: NEGATIVE
Protein, ur: NEGATIVE mg/dL
SPECIFIC GRAVITY, URINE: 1.02 (ref 1.005–1.030)
Urobilinogen, UA: 1 mg/dL (ref 0.0–1.0)
pH: 6 (ref 5.0–8.0)

## 2015-06-19 NOTE — Progress Notes (Signed)
Subjective:  Carly Cabrera is a 29 y.o. [phone] at [redacted]w[redacted]d being seen today for ongoing prenatal care.  Patient reports no complaints.  Contractions: Irregular.  Vag. Bleeding: None. Movement: Present. Denies leaking of fluid.   The following portions of the patient's history were reviewed and updated as appropriate: allergies, current medications, past family history, past medical history, past social history, past surgical history and problem list.   Objective:   Filed Vitals:   06/19/15 0835  BP: 109/73  Pulse: 89  Weight: 228 lb 1.6 oz (103.465 kg)    Fetal Status:     Movement: Present     General:  Alert, oriented and cooperative. Patient is in no acute distress.  Skin: Skin is warm and dry. No rash noted.   Cardiovascular: Normal heart rate noted  Respiratory: Normal respiratory effort, no problems with respiration noted  Abdomen: Soft, gravid, appropriate for gestational age. Pain/Pressure: Present     Vaginal: Vag. Bleeding: None.       Cervix: Not evaluated        Extremities: Normal range of motion.     Mental Status: Normal mood and affect. Normal behavior. Normal judgment and thought content.   Urinalysis: Urine Protein: Negative Urine Glucose: 3+ NST reviewed and reactive. FBS 68-105 2 hr 62-135 Assessment and Plan:  Pregnancy: G4P3003 at [redacted]w[redacted]d  1. Gestational diabetes mellitus, antepartum U/s for growth--pt. Has h/o 9 lb 14 oz delivery with no problems last pregnancy - Fetal nonstress test - US OB Follow Up; Future  2. Supervision of high risk pregnancy, antepartum, third trimester Continue routine prenatal care. Scheduled for IOL on Monday    Term labor symptoms and general obstetric precautions including but not limited to vaginal bleeding, contractions, leaking of fluid and fetal movement were reviewed in detail with the patient.  Please refer to After Visit Summary for other counseling recommendations.   Return in 6 months (on 12/20/2015) for pp  check, NST only in 4 days.   Reva Boresanya S Mykenzie Ebanks, MD

## 2015-06-19 NOTE — Patient Instructions (Signed)
Gestational Diabetes Mellitus Gestational diabetes mellitus, often simply referred to as gestational diabetes, is a type of diabetes that some women develop during pregnancy. In gestational diabetes, the pancreas does not make enough insulin (a hormone), the cells are less responsive to the insulin that is made (insulin resistance), or both.Normally, insulin moves sugars from food into the tissue cells. The tissue cells use the sugars for energy. The lack of insulin or the lack of normal response to insulin causes excess sugars to build up in the blood instead of going into the tissue cells. As a result, high blood sugar (hyperglycemia) develops. The effect of high sugar (glucose) levels can cause many problems.  RISK FACTORS You have an increased chance of developing gestational diabetes if you have a family history of diabetes and also have one or more of the following risk factors:  A body mass index over 30 (obesity).  A previous pregnancy with gestational diabetes.  An older age at the time of pregnancy. If blood glucose levels are kept in the normal range during pregnancy, women can have a healthy pregnancy. If your blood glucose levels are not well controlled, there may be risks to you, your unborn baby (fetus), your labor and delivery, or your newborn baby.  SYMPTOMS  If symptoms are experienced, they are much like symptoms you would normally expect during pregnancy. The symptoms of gestational diabetes include:   Increased thirst (polydipsia).  Increased urination (polyuria).  Increased urination during the night (nocturia).  Weight loss. This weight loss may be rapid.  Frequent, recurring infections.  Tiredness (fatigue).  Weakness.  Vision changes, such as blurred vision.  Fruity smell to your breath.  Abdominal pain. DIAGNOSIS Diabetes is diagnosed when blood glucose levels are increased. Your blood glucose level may be checked by one or more of the following blood  tests:  A fasting blood glucose test. You will not be allowed to eat for at least 8 hours before a blood sample is taken.  A random blood glucose test. Your blood glucose is checked at any time of the day regardless of when you ate.  A hemoglobin A1c blood glucose test. A hemoglobin A1c test provides information about blood glucose control over the previous 3 months.  An oral glucose tolerance test (OGTT). Your blood glucose is measured after you have not eaten (fasted) for 1-3 hours and then after you drink a glucose-containing beverage. Since the hormones that cause insulin resistance are highest at about 24-28 weeks of a pregnancy, an OGTT is usually performed during that time. If you have risk factors for gestational diabetes, your health care provider may test you for gestational diabetes earlier than 24 weeks of pregnancy. TREATMENT   You will need to take diabetes medicine or insulin daily to keep blood glucose levels in the desired range.  You will need to match insulin dosing with exercise and healthy food choices. The treatment goal is to maintain the before-meal (preprandial), bedtime, and overnight blood glucose level at 60-99 mg/dL during pregnancy. The treatment goal is to further maintain peak after-meal blood sugar (postprandial glucose) level at 100-140 mg/dL. HOME CARE INSTRUCTIONS   Have your hemoglobin A1c level checked twice a year.  Perform daily blood glucose monitoring as directed by your health care provider. It is common to perform frequent blood glucose monitoring.  Monitor urine ketones when you are ill and as directed by your health care provider.  Take your diabetes medicine and insulin as directed by your health care provider   to maintain your blood glucose level in the desired range.  Never run out of diabetes medicine or insulin. It is needed every day.  Adjust insulin based on your intake of carbohydrates. Carbohydrates can raise blood glucose levels but  need to be included in your diet. Carbohydrates provide vitamins, minerals, and fiber which are an essential part of a healthy diet. Carbohydrates are found in fruits, vegetables, whole grains, dairy products, legumes, and foods containing added sugars.  Eat healthy foods. Alternate 3 meals with 3 snacks.  Maintain a healthy weight gain. The usual total expected weight gain varies according to your prepregnancy body mass index (BMI).  Carry a medical alert card or wear your medical alert jewelry.  Carry a 15-gram carbohydrate snack with you at all times to treat low blood glucose (hypoglycemia). Some examples of 15-gram carbohydrate snacks include:  Glucose tablets, 3 or 4.  Glucose gel, 15-gram tube.  Raisins, 2 tablespoons (24 g).  Jelly beans, 6.  Animal crackers, 8.  Fruit juice, regular soda, or low-fat milk, 4 ounces (120 mL).  Gummy treats, 9.  Recognize hypoglycemia. Hypoglycemia during pregnancy occurs with blood glucose levels of 60 mg/dL and below. The risk for hypoglycemia increases when fasting or skipping meals, during or after intense exercise, and during sleep. Hypoglycemia symptoms can include:  Tremors or shakes.  Decreased ability to concentrate.  Sweating.  Increased heart rate.  Headache.  Dry mouth.  Hunger.  Irritability.  Anxiety.  Restless sleep.  Altered speech or coordination.  Confusion.  Treat hypoglycemia promptly. If you are alert and able to safely swallow, follow the 15:15 rule:  Take 15-20 grams of rapid-acting glucose or carbohydrate. Rapid-acting options include glucose gel, glucose tablets, or 4 ounces (120 mL) of fruit juice, regular soda, or low-fat milk.  Check your blood glucose level 15 minutes after taking the glucose.  Take 15-20 grams more of glucose if the repeat blood glucose level is still 70 mg/dL or below.  Eat a meal or snack within 1 hour once blood glucose levels return to normal.  Be alert to polyuria  (excess urination) and polydipsia (excess thirst) which are early signs of hyperglycemia. An early awareness of hyperglycemia allows for prompt treatment. Treat hyperglycemia as directed by your health care provider.  Engage in at least 30 minutes of physical activity a day or as directed by your health care provider. Ten minutes of physical activity timed 30 minutes after each meal is encouraged to control postprandial blood glucose levels.  Adjust your insulin dosing and food intake as needed if you start a new exercise or sport.  Follow your sick-day plan at any time you are unable to eat or drink as usual.  Avoid tobacco and alcohol use.  Keep all follow-up visits as directed by your health care provider.  Follow the advice of your health care provider regarding your prenatal and post-delivery (postpartum) appointments, meal planning, exercise, medicines, vitamins, blood tests, other medical tests, and physical activities.  Perform daily skin and foot care. Examine your skin and feet daily for cuts, bruises, redness, nail problems, bleeding, blisters, or sores.  Brush your teeth and gums at least twice a day and floss at least once a day. Follow up with your dentist regularly.  Schedule an eye exam during the first trimester of your pregnancy or as directed by your health care provider.  Share your diabetes management plan with your workplace or school.  Stay up-to-date with immunizations.  Learn to manage stress.    Obtain ongoing diabetes education and support as needed.  Learn about and consider breastfeeding your baby.  You should have your blood sugar level checked 6-12 weeks after delivery. This is done with an oral glucose tolerance test (OGTT). SEEK MEDICAL CARE IF:   You are unable to eat food or drink fluids for more than 6 hours.  You have nausea and vomiting for more than 6 hours.  You have a blood glucose level of 200 mg/dL and you have ketones in your  urine.  There is a change in mental status.  You develop vision problems.  You have a persistent headache.  You have upper abdominal pain or discomfort.  You develop an additional serious illness.  You have diarrhea for more than 6 hours.  You have been sick or have had a fever for a couple of days and are not getting better. SEEK IMMEDIATE MEDICAL CARE IF:   You have difficulty breathing.  You no longer feel the baby moving.  You are bleeding or have discharge from your vagina.  You start having premature contractions or labor. MAKE SURE YOU:  Understand these instructions.  Will watch your condition.  Will get help right away if you are not doing well or get worse. Document Released: 03/03/2001 Document Revised: 04/11/2014 Document Reviewed: 06/23/2012 [employer] Patient Information 2015 [employer]. This information is not intended to replace advice given to you by your health care provider. Make sure you discuss any questions you have with your health care provider.  Breastfeeding Deciding to breastfeed is one of the best choices you can make for you and your baby. A change in hormones during pregnancy causes your breast tissue to grow and increases the number and size of your milk ducts. These hormones also allow proteins, sugars, and fats from your blood supply to make breast milk in your milk-producing glands. Hormones prevent breast milk from being released before your baby is born as well as prompt milk flow after birth. Once breastfeeding has begun, thoughts of your baby, as well as his or her sucking or crying, can stimulate the release of milk from your milk-producing glands.  BENEFITS OF BREASTFEEDING For Your Baby  Your first milk (colostrum) helps your baby's digestive system function better.   There are antibodies in your milk that help your baby fight off infections.   Your baby has a lower incidence of asthma, allergies, and sudden infant death  syndrome.   The nutrients in breast milk are better for your baby than infant formulas and are designed uniquely for your baby's needs.   Breast milk improves your baby's brain development.   Your baby is less likely to develop other conditions, such as childhood obesity, asthma, or type 2 diabetes mellitus.  For You   Breastfeeding helps to create a very special bond between you and your baby.   Breastfeeding is convenient. Breast milk is always available at the correct temperature and costs nothing.   Breastfeeding helps to burn calories and helps you lose the weight gained during pregnancy.   Breastfeeding makes your uterus contract to its prepregnancy size faster and slows bleeding (lochia) after you give birth.   Breastfeeding helps to lower your risk of developing type 2 diabetes mellitus, osteoporosis, and breast or ovarian cancer later in life. SIGNS THAT YOUR BABY IS HUNGRY Early Signs of Hunger  Increased alertness or activity.  Stretching.  Movement of the head from side to side.  Movement of the head and opening of the   mouth when the corner of the mouth or cheek is stroked (rooting).  Increased sucking sounds, smacking lips, cooing, sighing, or squeaking.  Hand-to-mouth movements.  Increased sucking of fingers or hands. Late Signs of Hunger  Fussing.  Intermittent crying. Extreme Signs of Hunger Signs of extreme hunger will require calming and consoling before your baby will be able to breastfeed successfully. Do not wait for the following signs of extreme hunger to occur before you initiate breastfeeding:   Restlessness.  A loud, strong cry.   Screaming. BREASTFEEDING BASICS Breastfeeding Initiation  Find a comfortable place to sit or lie down, with your neck and back well supported.  Place a pillow or rolled up blanket under your baby to bring him or her to the level of your breast (if you are seated). Nursing pillows are specially designed  to help support your arms and your baby while you breastfeed.  Make sure that your baby's abdomen is facing your abdomen.   Gently massage your breast. With your fingertips, massage from your chest wall toward your nipple in a circular motion. This encourages milk flow. You may need to continue this action during the feeding if your milk flows slowly.  Support your breast with 4 fingers underneath and your thumb above your nipple. Make sure your fingers are well away from your nipple and your baby's mouth.   Stroke your baby's lips gently with your finger or nipple.   When your baby's mouth is open wide enough, quickly bring your baby to your breast, placing your entire nipple and as much of the colored area around your nipple (areola) as possible into your baby's mouth.   More areola should be visible above your baby's upper lip than below the lower lip.   Your baby's tongue should be between his or her lower gum and your breast.   Ensure that your baby's mouth is correctly positioned around your nipple (latched). Your baby's lips should create a seal on your breast and be turned out (everted).  It is common for your baby to suck about 2-3 minutes in order to start the flow of breast milk. Latching Teaching your baby how to latch on to your breast properly is very important. An improper latch can cause nipple pain and decreased milk supply for you and poor weight gain in your baby. Also, if your baby is not latched onto your nipple properly, he or she may swallow some air during feeding. This can make your baby fussy. Burping your baby when you switch breasts during the feeding can help to get rid of the air. However, teaching your baby to latch on properly is still the best way to prevent fussiness from swallowing air while breastfeeding. Signs that your baby has successfully latched on to your nipple:    Silent tugging or silent sucking, without causing you pain.   Swallowing  heard between every 3-4 sucks.    Muscle movement above and in front of his or her ears while sucking.  Signs that your baby has not successfully latched on to nipple:   Sucking sounds or smacking sounds from your baby while breastfeeding.  Nipple pain. If you think your baby has not latched on correctly, slip your finger into the corner of your baby's mouth to break the suction and place it between your baby's gums. Attempt breastfeeding initiation again. Signs of Successful Breastfeeding Signs from your baby:   A gradual decrease in the number of sucks or complete cessation of sucking.     Falling asleep.   Relaxation of his or her body.   Retention of a small amount of milk in his or her mouth.   Letting go of your breast by himself or herself. Signs from you:  Breasts that have increased in firmness, weight, and size 1-3 hours after feeding.   Breasts that are softer immediately after breastfeeding.  Increased milk volume, as well as a change in milk consistency and color by the fifth day of breastfeeding.   Nipples that are not sore, cracked, or bleeding. Signs That Your Baby is Getting Enough Milk  Wetting at least 3 diapers in a 24-hour period. The urine should be clear and pale yellow by age 5 days.  At least 3 stools in a 24-hour period by age 5 days. The stool should be soft and yellow.  At least 3 stools in a 24-hour period by age 7 days. The stool should be seedy and yellow.  No loss of weight greater than 10% of birth weight during the first 3 days of age.  Average weight gain of 4-7 ounces (113-198 g) per week after age 4 days.  Consistent daily weight gain by age 5 days, without weight loss after the age of 2 weeks. After a feeding, your baby may spit up a small amount. This is common. BREASTFEEDING FREQUENCY AND DURATION Frequent feeding will help you make more milk and can prevent sore nipples and breast engorgement. Breastfeed when you feel the  need to reduce the fullness of your breasts or when your baby shows signs of hunger. This is called "breastfeeding on demand." Avoid introducing a pacifier to your baby while you are working to establish breastfeeding (the first 4-6 weeks after your baby is born). After this time you may choose to use a pacifier. Research has shown that pacifier use during the first year of a baby's life decreases the risk of sudden infant death syndrome (SIDS). Allow your baby to feed on each breast as long as he or she wants. Breastfeed until your baby is finished feeding. When your baby unlatches or falls asleep while feeding from the first breast, offer the second breast. Because newborns are often sleepy in the first few weeks of life, you may need to awaken your baby to get him or her to feed. Breastfeeding times will vary from baby to baby. However, the following rules can serve as a guide to help you ensure that your baby is properly fed:  Newborns (babies 4 weeks of age or younger) may breastfeed every 1-3 hours.  Newborns should not go longer than 3 hours during the day or 5 hours during the night without breastfeeding.  You should breastfeed your baby a minimum of 8 times in a 24-hour period until you begin to introduce solid foods to your baby at around 6 months of age. BREAST MILK PUMPING Pumping and storing breast milk allows you to ensure that your baby is exclusively fed your breast milk, even at times when you are unable to breastfeed. This is especially important if you are going back to work while you are still breastfeeding or when you are not able to be present during feedings. Your lactation consultant can give you guidelines on how long it is safe to store breast milk.  A breast pump is a machine that allows you to pump milk from your breast into a sterile bottle. The pumped breast milk can then be stored in a refrigerator or freezer. Some breast pumps are operated by   hand, while others use  electricity. Ask your lactation consultant which type will work best for you. Breast pumps can be purchased, but some hospitals and breastfeeding support groups lease breast pumps on a monthly basis. A lactation consultant can teach you how to hand express breast milk, if you prefer not to use a pump.  CARING FOR YOUR BREASTS WHILE YOU BREASTFEED Nipples can become dry, cracked, and sore while breastfeeding. The following recommendations can help keep your breasts moisturized and healthy:  Avoid using soap on your nipples.   Wear a supportive bra. Although not required, special nursing bras and tank tops are designed to allow access to your breasts for breastfeeding without taking off your entire bra or top. Avoid wearing underwire-style bras or extremely tight bras.  Air dry your nipples for 3-4minutes after each feeding.   Use only cotton bra pads to absorb leaked breast milk. Leaking of breast milk between feedings is normal.   Use lanolin on your nipples after breastfeeding. Lanolin helps to maintain your skin's normal moisture barrier. If you use pure lanolin, you do not need to wash it off before feeding your baby again. Pure lanolin is not toxic to your baby. You may also hand express a few drops of breast milk and gently massage that milk into your nipples and allow the milk to air dry. In the first few weeks after giving birth, some women experience extremely full breasts (engorgement). Engorgement can make your breasts feel heavy, warm, and tender to the touch. Engorgement peaks within 3-5 days after you give birth. The following recommendations can help ease engorgement:  Completely empty your breasts while breastfeeding or pumping. You may want to start by applying warm, moist heat (in the shower or with warm water-soaked hand towels) just before feeding or pumping. This increases circulation and helps the milk flow. If your baby does not completely empty your breasts while  breastfeeding, pump any extra milk after he or she is finished.  Wear a snug bra (nursing or regular) or tank top for 1-2 days to signal your body to slightly decrease milk production.  Apply ice packs to your breasts, unless this is too uncomfortable for you.  Make sure that your baby is latched on and positioned properly while breastfeeding. If engorgement persists after 48 hours of following these recommendations, contact your health care provider or a lactation consultant. OVERALL HEALTH CARE RECOMMENDATIONS WHILE BREASTFEEDING  Eat healthy foods. Alternate between meals and snacks, eating 3 of each per day. Because what you eat affects your breast milk, some of the foods may make your baby more irritable than usual. Avoid eating these foods if you are sure that they are negatively affecting your baby.  Drink milk, fruit juice, and water to satisfy your thirst (about 10 glasses a day).   Rest often, relax, and continue to take your prenatal vitamins to prevent fatigue, stress, and anemia.  Continue breast self-awareness checks.  Avoid chewing and smoking tobacco.  Avoid alcohol and drug use. Some medicines that may be harmful to your baby can pass through breast milk. It is important to ask your health care provider before taking any medicine, including all over-the-counter and prescription medicine as well as vitamin and herbal supplements. It is possible to become pregnant while breastfeeding. If birth control is desired, ask your health care provider about options that will be safe for your baby. SEEK MEDICAL CARE IF:   You feel like you want to stop breastfeeding or have become   frustrated with breastfeeding.  You have painful breasts or nipples.  Your nipples are cracked or bleeding.  Your breasts are red, tender, or warm.  You have a swollen area on either breast.  You have a fever or chills.  You have nausea or vomiting.  You have drainage other than breast milk from  your nipples.  Your breasts do not become full before feedings by the fifth day after you give birth.  You feel sad and depressed.  Your baby is too sleepy to eat well.  Your baby is having trouble sleeping.   Your baby is wetting less than 3 diapers in a 24-hour period.  Your baby has less than 3 stools in a 24-hour period.  Your baby's skin or the white part of his or her eyes becomes yellow.   Your baby is not gaining weight by 5 days of age. SEEK IMMEDIATE MEDICAL CARE IF:   Your baby is overly tired (lethargic) and does not want to wake up and feed.  Your baby develops an unexplained fever. Document Released: 11/25/2005 Document Revised: 11/30/2013 Document Reviewed: 05/19/2013 [employer] Patient Information 2015 [employer]. This information is not intended to replace advice given to you by your health care provider. Make sure you discuss any questions you have with your health care provider.  

## 2015-06-19 NOTE — Progress Notes (Signed)
Growth ultrasound scheduled for June 23, 2015 @ 7:30 AM

## 2015-06-20 ENCOUNTER — Telehealth: Payer: Self-pay

## 2015-06-20 ENCOUNTER — Telehealth (HOSPITAL_COMMUNITY): Payer: Self-pay | Admitting: *Deleted

## 2015-06-20 ENCOUNTER — Other Ambulatory Visit: Payer: Self-pay | Admitting: *Deleted

## 2015-06-20 DIAGNOSIS — O2243 Hemorrhoids in pregnancy, third trimester: Secondary | ICD-10-CM

## 2015-06-20 MED ORDER — DOCUSATE SODIUM 100 MG PO CAPS: 100.0000 mg | ORAL_CAPSULE | Freq: Two times a day (BID) | ORAL | Status: DC

## 2015-06-20 MED ORDER — TUCKS 50 % EX PADS: 1.0000 "application " | MEDICATED_PAD | Freq: Four times a day (QID) | CUTANEOUS | Status: DC | PRN

## 2015-06-20 NOTE — Telephone Encounter (Signed)
[facility] was contacted and pt had concerns of hemorrhoids.  Called pt and pt informed me that she has already been taken care of.  Looking at pts chart Dr[facility] L[facility]osta [facility]

## 2015-06-20 NOTE — Telephone Encounter (Signed)
Preadmission screen  

## 2015-06-23 ENCOUNTER — Ambulatory Visit (HOSPITAL_COMMUNITY)
Admission: RE | Admit: 2015-06-23 | Discharge: 2015-06-23 | Disposition: A | Discharge status: Home / Self Care | Payer: BLUE CROSS/BLUE SHIELD | Source: Ambulatory Visit | Attending: Family Medicine | Admitting: Family Medicine

## 2015-06-23 ENCOUNTER — Ambulatory Visit (INDEPENDENT_AMBULATORY_CARE_PROVIDER_SITE_OTHER): Payer: BLUE CROSS/BLUE SHIELD | Admitting: *Deleted

## 2015-06-23 ENCOUNTER — Other Ambulatory Visit: Payer: Self-pay | Admitting: Family Medicine

## 2015-06-23 VITALS — BP 109/77 | HR 76

## 2015-06-23 DIAGNOSIS — O24419 Gestational diabetes mellitus in pregnancy, unspecified control: Secondary | ICD-10-CM

## 2015-06-23 DIAGNOSIS — Z3A38 38 weeks gestation of pregnancy: Secondary | ICD-10-CM | POA: Insufficient documentation

## 2015-06-23 NOTE — Progress Notes (Signed)
nst reactive

## 2015-06-23 NOTE — Progress Notes (Signed)
US for growth done today.   EFW >90%, 4004 gm; AFI - 31.63 cm.  BPP 8/8.

## 2015-06-26 ENCOUNTER — Inpatient Hospital Stay (HOSPITAL_COMMUNITY)
Admission: RE | Admit: 2015-06-26 | Discharge: 2015-06-29 | DRG: 775 | Disposition: A | Discharge status: Home / Self Care | Payer: BLUE CROSS/BLUE SHIELD | Source: Ambulatory Visit | Attending: Obstetrics & Gynecology | Admitting: Obstetrics & Gynecology

## 2015-06-26 ENCOUNTER — Encounter (HOSPITAL_COMMUNITY): Payer: Self-pay

## 2015-06-26 VITALS — BP 102/57 | HR 76 | Temp 99.0°F | Resp 18 | Ht 66.0 in | Wt 228.0 lb

## 2015-06-26 DIAGNOSIS — Z3A39 39 weeks gestation of pregnancy: Secondary | ICD-10-CM | POA: Diagnosis present

## 2015-06-26 DIAGNOSIS — Z79899 Other long term (current) drug therapy: Secondary | ICD-10-CM | POA: Diagnosis not present

## 2015-06-26 DIAGNOSIS — Z8249 Family history of ischemic heart disease and other diseases of the circulatory system: Secondary | ICD-10-CM

## 2015-06-26 DIAGNOSIS — Z833 Family history of diabetes mellitus: Secondary | ICD-10-CM | POA: Diagnosis not present

## 2015-06-26 DIAGNOSIS — Z789 Other specified health status: Secondary | ICD-10-CM | POA: Insufficient documentation

## 2015-06-26 DIAGNOSIS — O24419 Gestational diabetes mellitus in pregnancy, unspecified control: Secondary | ICD-10-CM | POA: Diagnosis present

## 2015-06-26 DIAGNOSIS — O0993 Supervision of high risk pregnancy, unspecified, third trimester: Secondary | ICD-10-CM

## 2015-06-26 DIAGNOSIS — O24429 Gestational diabetes mellitus in childbirth, unspecified control: Secondary | ICD-10-CM | POA: Diagnosis present

## 2015-06-26 HISTORY — DX: Gestational diabetes mellitus in pregnancy, unspecified control: O24.419

## 2015-06-26 LAB — CBC
HCT: 29.8 % — ABNORMAL LOW (ref 36.0–46.0)
HEMOGLOBIN: 9.7 g/dL — AB (ref 12.0–15.0)
MCH: 25 pg — AB (ref 26.0–34.0)
MCHC: 32.6 g/dL (ref 30.0–36.0)
MCV: 76.8 fL — ABNORMAL LOW (ref 78.0–100.0)
PLATELETS: 241 10*3/uL (ref 150–400)
RBC: 3.88 MIL/uL (ref 3.87–5.11)
RDW: 15.7 % — ABNORMAL HIGH (ref 11.5–15.5)
WBC: 5.4 10*3/uL (ref 4.0–10.5)

## 2015-06-26 LAB — GLUCOSE, CAPILLARY
GLUCOSE-CAPILLARY: 81 mg/dL (ref 65–99)
GLUCOSE-CAPILLARY: 78 mg/dL (ref 65–99)
GLUCOSE-CAPILLARY: 70 mg/dL (ref 65–99)
GLUCOSE-CAPILLARY: 116 mg/dL — AB (ref 65–99)
Glucose-Capillary: 74 mg/dL (ref 65–99)
Glucose-Capillary: 70 mg/dL (ref 65–99)

## 2015-06-26 LAB — TYPE AND SCREEN
ABO/RH(D): B POS
Antibody Screen: NEGATIVE

## 2015-06-26 LAB — GLUCOSE, RANDOM: Glucose, Bld: 155 mg/dL — ABNORMAL HIGH (ref 65–99)

## 2015-06-26 MED ORDER — TERBUTALINE SULFATE 1 MG/ML IJ SOLN: 0.2500 mg | Freq: Once | INTRAMUSCULAR | Status: AC | PRN

## 2015-06-26 MED ORDER — LACTATED RINGERS IV SOLN
500.0000 mL | INTRAVENOUS | Status: DC | PRN
  Administered 2015-06-26: 500 mL via INTRAVENOUS

## 2015-06-26 MED ORDER — ONDANSETRON HCL 4 MG/2ML IJ SOLN: 4.0000 mg | Freq: Four times a day (QID) | INTRAMUSCULAR | Status: DC | PRN

## 2015-06-26 MED ORDER — CITRIC ACID-SODIUM CITRATE 334-500 MG/5ML PO SOLN: 30.0000 mL | ORAL | Status: DC | PRN

## 2015-06-26 MED ORDER — DEXTROSE 50 % IV SOLN: 25.0000 mL | INTRAVENOUS | Status: DC | PRN

## 2015-06-26 MED ORDER — LACTATED RINGERS IV SOLN
INTRAVENOUS | Status: DC
  Administered 2015-06-26: 11:00:00 via INTRAVENOUS

## 2015-06-26 MED ORDER — OXYTOCIN BOLUS FROM INFUSION: 500.0000 mL | INTRAVENOUS | Status: DC

## 2015-06-26 MED ORDER — LIDOCAINE HCL (PF) 1 % IJ SOLN
30.0000 mL | INTRAMUSCULAR | Status: AC | PRN
  Administered 2015-06-27: 30 mL via SUBCUTANEOUS
  Filled 2015-06-26: qty 30

## 2015-06-26 MED ORDER — TERBUTALINE SULFATE 1 MG/ML IJ SOLN
0.2500 mg | Freq: Once | INTRAMUSCULAR | Status: AC | PRN
  Filled 2015-06-26: qty 1

## 2015-06-26 MED ORDER — OXYCODONE-ACETAMINOPHEN 5-325 MG PO TABS: 1.0000 | ORAL_TABLET | ORAL | Status: DC | PRN

## 2015-06-26 MED ORDER — OXYTOCIN 40 UNITS IN LACTATED RINGERS INFUSION - SIMPLE MED
1.0000 m[IU]/min | INTRAVENOUS | Status: DC
  Administered 2015-06-26: 2 m[IU]/min via INTRAVENOUS
  Filled 2015-06-26: qty 1000

## 2015-06-26 MED ORDER — ACETAMINOPHEN 325 MG PO TABS
650.0000 mg | ORAL_TABLET | ORAL | Status: DC | PRN
Start: 2015-06-26 — End: 2015-06-27

## 2015-06-26 MED ORDER — SODIUM CHLORIDE 0.9 % IV SOLN
INTRAVENOUS | Status: DC
  Administered 2015-06-26: 10:00:00 via INTRAVENOUS
  Filled 2015-06-26: qty 2.5

## 2015-06-26 MED ORDER — MISOPROSTOL 200 MCG PO TABS
50.0000 ug | ORAL_TABLET | ORAL | Status: DC
  Administered 2015-06-26: 50 ug via ORAL
  Filled 2015-06-26: qty 0.5

## 2015-06-26 MED ORDER — OXYCODONE-ACETAMINOPHEN 5-325 MG PO TABS: 2.0000 | ORAL_TABLET | ORAL | Status: DC | PRN

## 2015-06-26 MED ORDER — ZOLPIDEM TARTRATE 5 MG PO TABS: 5.0000 mg | ORAL_TABLET | Freq: Every evening | ORAL | Status: DC | PRN

## 2015-06-26 MED ORDER — OXYTOCIN 40 UNITS IN LACTATED RINGERS INFUSION - SIMPLE MED: 62.5000 mL/h | INTRAVENOUS | Status: DC

## 2015-06-26 MED ORDER — FENTANYL CITRATE (PF) 100 MCG/2ML IJ SOLN: 100.0000 ug | INTRAMUSCULAR | Status: DC | PRN

## 2015-06-26 MED ORDER — FLEET ENEMA 7-19 GM/118ML RE ENEM: 1.0000 | ENEMA | RECTAL | Status: DC | PRN

## 2015-06-26 MED ORDER — SODIUM CHLORIDE 0.9 % IV SOLN: INTRAVENOUS | Status: DC

## 2015-06-26 MED ORDER — TERBUTALINE SULFATE 1 MG/ML IJ SOLN
0.2500 mg | Freq: Once | INTRAMUSCULAR | Status: AC
  Administered 2015-06-26: 0.25 mg via SUBCUTANEOUS

## 2015-06-26 MED ORDER — DEXTROSE-NACL 5-0.45 % IV SOLN: INTRAVENOUS | Status: DC

## 2015-06-26 NOTE — H&P (Signed)
Carly Cabrera is a 29 y.o. female [phone] @ 39 wks presenting for IOL for GDM. Maternal Medical History:  Reason for admission: IOL for GDM  Fetal activity: Perceived fetal activity is normal.   Last perceived fetal movement was within the past hour.    Prenatal Complications - Diabetes: gestational. Diabetes is managed by oral agent (monotherapy).      OB History    Gravida Para Term Preterm AB TAB SAB Ectopic Multiple Living   4 3 3  0 0 0 0 0 0 3     Past Medical History  Diagnosis Date  . Medical history non-contributory    Past Surgical History  Procedure Laterality Date  . No past surgeries     Family History: family history includes Diabetes in her father; Hypertension in her father. Social History:  reports that she has never smoked. She has never used smokeless tobacco. She reports that she does not drink alcohol or use illicit drugs.   Prenatal Transfer Tool  Maternal Diabetes: Yes:  Diabetes Type:  Insulin/Medication controlled Genetic Screening: Normal Maternal Ultrasounds/Referrals: Normal Fetal Ultrasounds or other Referrals:  None Maternal Substance Abuse:  No Significant Maternal Medications:  Meds include: Other:  Significant Maternal Lab Results:  None Other Comments:  None  Review of Systems  Constitutional: Negative.   HENT: Negative.   Eyes: Negative.   Respiratory: Negative.   Cardiovascular: Negative.   Gastrointestinal: Negative.   Genitourinary: Negative.   Musculoskeletal: Negative.   Skin: Negative.   Neurological: Negative.   Endo/Heme/Allergies: Negative.   Psychiatric/Behavioral: Negative.       Blood pressure 131/87, pulse 84, temperature 98 F (36.7 C), temperature source Oral, resp. rate 18, height 5\' 6"  (1.676 m), weight 228 lb (103.42 kg), last menstrual period 09/26/2014. Maternal Exam:  Uterine Assessment: No uc's  Abdomen: Patient reports no abdominal tenderness. Fetal presentation: vertex  Introitus: Normal vulva.  Normal vagina.  Amniotic fluid character: not assessed.  Pelvis: adequate for delivery.   Cervix: Cervix evaluated by digital exam.     Fetal Exam Fetal Monitor Review: Mode: ultrasound.   Variability: moderate (6-25 bpm).   Pattern: accelerations present.    Fetal State Assessment: Category I - tracings are normal.     Physical Exam  Constitutional: She is oriented to person, place, and time. She appears well-developed and well-nourished.  HENT:  Head: Normocephalic.  Eyes: Pupils are equal, round, and reactive to light.  Cardiovascular: Normal rate, regular rhythm, normal heart sounds and intact distal pulses.   Respiratory: Effort normal and breath sounds normal.  GI: Soft. Bowel sounds are normal.  Genitourinary: Vagina normal and uterus normal.  Musculoskeletal: Normal range of motion.  Neurological: She is alert and oriented to person, place, and time. She has normal reflexes.  Skin: Skin is warm and dry.  Psychiatric: She has a normal mood and affect. Her behavior is normal. Judgment and thought content normal.    Prenatal labs: ABO, Rh: B/Positive/-- (12/28 0000) Antibody: Negative (12/28 0000) Rubella: Immune (12/28 0000) RPR: Nonreactive (12/28 0000)  HBsAg: Negative (12/28 0000)  HIV: Non-reactive (12/28 0000)  GBS: Negative (06/27 0000)   Assessment/Plan: IOL for GDM, SVE tft/th/post/high. Koreas confirmed vertex. will start po cytotec   Carly Cabrera 06/26/2015, 8:04 AM

## 2015-06-26 NOTE — Progress Notes (Signed)
CBG  70, no insulin given

## 2015-06-26 NOTE — Progress Notes (Signed)
This note also relates to the following rows which could not be included: BP - Cannot attach notes to unvalidated device data Pulse Rate - Cannot attach notes to unvalidated device data   Insulin started at 0.2 IVPB

## 2015-06-26 NOTE — Progress Notes (Signed)
CBG 74 no insulin

## 2015-06-26 NOTE — Progress Notes (Signed)
Carly Cabrera is Cabrera 29 y.o. [phone] at 623w0d admitted for induction of labor due to Gestational diabetes.  Subjective: No complaints. Had some deep variable decels after initial cytotec dose that resolved.   Objective: BP 113/54 mmHg  Pulse 78  Temp(Src) 98.5 F (36.9 C) (Oral)  Resp 18  Ht 5\' 6"  (1.676 m)  Wt 228 lb (103.42 kg)  BMI 36.82 kg/m2  LMP 09/26/2014 (Exact Date)     FHT:  FHR: 135 bpm, variability: moderate,  accelerations:  Present,  decelerations:  Absent UC:   regular, every 3-4 minutes SVE:   Dilation: 1 Effacement (%): Thick Station: Ballotable Exam by:: Carly Cabrera foley bulb placed, cervical balloon inflated with 60 ml of fluid, vaginal bulb with 30 ml Labs: Lab Results  Component Value Date   WBC 5.4 06/26/2015   HGB 9.7* 06/26/2015   HCT 29.8* 06/26/2015   MCV 76.8* 06/26/2015   PLT 241 06/26/2015    Assessment / Plan: Induction of labor due to gestational diabetes,  foley bulb placed  Labor: Progressing normally Fetal Wellbeing:  Category I Pain Control:  Labor support without medications  Diabetes:  Stable blood sugars I/D:  GBS negative Anticipated MOD:  NSVD  Carly Nobles A, MD 06/26/2015, 2:10 PM

## 2015-06-27 ENCOUNTER — Encounter (HOSPITAL_COMMUNITY): Payer: Self-pay

## 2015-06-27 DIAGNOSIS — O24429 Gestational diabetes mellitus in childbirth, unspecified control: Secondary | ICD-10-CM

## 2015-06-27 DIAGNOSIS — Z3A39 39 weeks gestation of pregnancy: Secondary | ICD-10-CM

## 2015-06-27 LAB — GLUCOSE, CAPILLARY
GLUCOSE-CAPILLARY: 109 mg/dL — AB (ref 65–99)
GLUCOSE-CAPILLARY: 102 mg/dL — AB (ref 65–99)

## 2015-06-27 LAB — RPR: RPR: NONREACTIVE

## 2015-06-27 LAB — ABO/RH: ABO/RH(D): B POS

## 2015-06-27 MED ORDER — DIPHENHYDRAMINE HCL 25 MG PO CAPS: 25.0000 mg | ORAL_CAPSULE | Freq: Four times a day (QID) | ORAL | Status: DC | PRN

## 2015-06-27 MED ORDER — TETANUS-DIPHTH-ACELL PERTUSSIS 5-2.5-18.5 LF-MCG/0.5 IM SUSP: 0.5000 mL | Freq: Once | INTRAMUSCULAR | Status: DC

## 2015-06-27 MED ORDER — OXYCODONE-ACETAMINOPHEN 5-325 MG PO TABS: 2.0000 | ORAL_TABLET | ORAL | Status: DC | PRN

## 2015-06-27 MED ORDER — PRENATAL MULTIVITAMIN CH
1.0000 | ORAL_TABLET | Freq: Every day | ORAL | Status: DC
  Administered 2015-06-27 – 2015-06-29 (×3): 1 via ORAL
  Filled 2015-06-27 (×3): qty 1

## 2015-06-27 MED ORDER — ZOLPIDEM TARTRATE 5 MG PO TABS: 5.0000 mg | ORAL_TABLET | Freq: Every evening | ORAL | Status: DC | PRN

## 2015-06-27 MED ORDER — ACETAMINOPHEN 325 MG PO TABS: 650.0000 mg | ORAL_TABLET | ORAL | Status: DC | PRN

## 2015-06-27 MED ORDER — ONDANSETRON HCL 4 MG/2ML IJ SOLN: 4.0000 mg | INTRAMUSCULAR | Status: DC | PRN

## 2015-06-27 MED ORDER — DIBUCAINE 1 % RE OINT: 1.0000 "application " | TOPICAL_OINTMENT | RECTAL | Status: DC | PRN

## 2015-06-27 MED ORDER — SENNOSIDES-DOCUSATE SODIUM 8.6-50 MG PO TABS
2.0000 | ORAL_TABLET | ORAL | Status: DC
  Administered 2015-06-28 – 2015-06-29 (×2): 2 via ORAL
  Filled 2015-06-27 (×2): qty 2

## 2015-06-27 MED ORDER — ONDANSETRON HCL 4 MG PO TABS: 4.0000 mg | ORAL_TABLET | ORAL | Status: DC | PRN

## 2015-06-27 MED ORDER — WITCH HAZEL-GLYCERIN EX PADS: 1.0000 "application " | MEDICATED_PAD | CUTANEOUS | Status: DC | PRN

## 2015-06-27 MED ORDER — IBUPROFEN 600 MG PO TABS
600.0000 mg | ORAL_TABLET | Freq: Four times a day (QID) | ORAL | Status: DC
  Administered 2015-06-27 – 2015-06-29 (×10): 600 mg via ORAL
  Filled 2015-06-27 (×10): qty 1

## 2015-06-27 MED ORDER — SIMETHICONE 80 MG PO CHEW: 80.0000 mg | CHEWABLE_TABLET | ORAL | Status: DC | PRN

## 2015-06-27 MED ORDER — OXYCODONE-ACETAMINOPHEN 5-325 MG PO TABS
1.0000 | ORAL_TABLET | ORAL | Status: DC | PRN
Start: 2015-06-27 — End: 2015-06-29
  Administered 2015-06-27 – 2015-06-28 (×3): 1 via ORAL
  Filled 2015-06-27 (×3): qty 1

## 2015-06-27 MED ORDER — BENZOCAINE-MENTHOL 20-0.5 % EX AERO
1.0000 "application " | INHALATION_SPRAY | CUTANEOUS | Status: DC | PRN
  Administered 2015-06-27: 1 via TOPICAL
  Filled 2015-06-27: qty 56

## 2015-06-27 MED ORDER — LANOLIN HYDROUS EX OINT: TOPICAL_OINTMENT | CUTANEOUS | Status: DC | PRN

## 2015-06-27 NOTE — Progress Notes (Signed)
Per NT CBG this AM at approx 6am was 102.

## 2015-06-27 NOTE — Lactation Note (Signed)
This note was copied from the chart of Carly Cabrera. Lactation Consultation Note  Patient Name: Carly Cabrera: 06/27/2015 Reason for consult: Initial assessment Mom is experienced BF and reports this baby is nursing well. Mom denies questions/concerns. Lactation brochure left for review, advised of OP services and support group. Encouraged to call if she would like assist.   Maternal Data Has patient been taught Hand Expression?: No (Mom is experienced BF and reports she knows how to H/E) Does the patient have breastfeeding experience prior to this delivery?: Yes  Feeding Feeding Type: Breast Fed Length of feed: 15 min  LATCH Score/Interventions                      Lactation Tools Discussed/Used WIC Program: Yes   Consult Status Consult Status: PRN    Alfred LevinsGranger, Emerald Shor Ann 06/27/2015, 2:34 PM

## 2015-06-28 LAB — GLUCOSE, CAPILLARY: Glucose-Capillary: 74 mg/dL (ref 65–99)

## 2015-06-28 NOTE — Progress Notes (Signed)
Post Partum Day #1 Subjective:  Carly Cabrera is a 29 y.o. female [id number] PPD#1 SVD with IOL for A2/B DM at 1534w1d gestation up ad lib, voiding, tolerating PO, + flatus and bleeding improving. No BM yet. Pain controlled with Motrin and Percocet. Got dizzy once yesterday when walking, but none since. No N/V.  GBS neg Female/breast/considering LARC or nexplanon  Objective: Blood pressure 117/85, pulse 90, temperature 98.3 F (36.8 C), temperature source Oral, resp. rate 18, height 5\' 6"  (1.676 m), weight 103.42 kg (228 lb), last menstrual period 09/26/2014, SpO2 98 %, unknown if currently breastfeeding.  Physical Exam:  General: alert, cooperative and no distress Lochia: appropriate Uterine Fundus: firm Incision: N/A DVT Evaluation: No evidence of DVT seen on physical exam. Negative Homan's sign.   Recent Labs  06/26/15 0710  HGB 9.7*  HCT 29.8*  Glucose at 0623 06/28/2015: 74  Assessment/Plan: Discharge home, Breastfeeding and Contraception considering LARC or nexplanon  [facility]    LOS: 2 days   Arliss JourneyWorley, Michelle 06/28/2015, 7:51 AM   CNM attestation Post Partum Day #1   Carly LiMagdalene Woolford is a 29 y.o. [id number] s/p SVD.  Pt denies problems with ambulating, voiding or po intake. Pain is well controlled.  Plan for birth control is IUD.  Method of Feeding: breast  PE:  BP 117/85 mmHg  Pulse 90  Temp(Src) 98.3 F (36.8 C) (Oral)  Resp 18  Ht 5\' 6"  (1.676 m)  Wt 103.42 kg (228 lb)  BMI 36.82 kg/m2  SpO2 98%  LMP 09/26/2014 (Exact Date)  Breastfeeding? Unknown Fundus firm  Plan for discharge: 06/29/15 due to infant being kept for jaundice tx  Cam HaiSHAW, KIMBERLY, CNM 11:16 AM

## 2015-06-29 MED ORDER — IBUPROFEN 600 MG PO TABS: 600.0000 mg | ORAL_TABLET | Freq: Four times a day (QID) | ORAL | Status: DC

## 2015-06-29 NOTE — Discharge Summary (Signed)
Obstetric Discharge Summary Reason for Admission: induction of labor 2/2 A2/B DM at [redacted]w[redacted]d gestation Prenatal Procedures: ultrasound and Quad and GTT Intrapartum Procedures: spontaneous vaginal delivery Postpartum Procedures: suture repair of 2nd degree perineal laceration Complications-Operative and Postpartum: 2nd degree perineal laceration  Delivery Note Membranes were artificially ruptured at 0023 w/ pt at approx 6cm, then with the next couple of contractions at 12:28 AM a viable female was delivered via Vaginal, Spontaneous Delivery (Presentation: Left Occiput Anterior). APGAR: 8, 9; weight 8 lb 14.8 oz (4048 g).  Placenta status: Intact, Spontaneous. Cord: 3 vessels   Anesthesia: Local  Episiotomy: None Lacerations: 2nd degree;Perineal Suture Repair: 0.0 and 3.0 Vicryl Est. Blood Loss (mL): 100  Dr Macon Large called in to assist w/ repair as was thought initially to be a partial 3rd. She completed the repair in full including adding support to the perineal body/capsule.  Mom to postpartum. Baby to Couplet care / Skin to Skin.  Hospital Course:  Carly Cabrera is a 29 y.o. [phone] s/p PPD#2 SVD at [redacted]w[redacted]d gestation of liveborn female with repair of 2nd degree perineal laceration.  Patient was admitted on 06/26/2015 for IOL 2/2 A2/B DM. DM managed with glyburide. She has postpartum course that was uncomplicated including no problems with ambulating, PO intake, urination, pain, or bleeding. CBC stable. PP glucose 74 on 06/28/2015. The pt feels ready to go home and will be discharged with outpatient follow-up.   Today: No acute events overnight.  Pt denies problems with ambulating, voiding or po intake.  She denies nausea or vomiting.  Pain is well controlled- 2/10 in lower abdomen.  She has had flatus. She has not had bowel movement.  Lochia Small.  Plan for birth control is IUD.  Method of Feeding: Breast  Physical Exam:  BP 102/57 mmHg  Pulse 76  Temp(Src) 99 F (37.2 C) (Oral)   Resp 18  Ht 5\' 6"  (1.676 m)  Wt 103.42 kg (228 lb)  BMI 36.82 kg/m2  SpO2 98%  LMP 09/26/2014 (Exact Date)  Breastfeeding? Unknown- yes breastfeeding General: alert, cooperative and no distress Lochia: appropriate Uterine Fundus: firm Abdomen: soft, nontender, normoactive bowel sounds Incision: N/A DVT Evaluation: No evidence of DVT seen on physical exam. Negative Homan's sign. No significant calf/ankle edema.  H/H: Lab Results  Component Value Date/Time   HGB 9.7* 06/26/2015 07:10 AM   HGB 11.7 12/05/2014   HCT 29.8* 06/26/2015 07:10 AM   HCT 36 12/05/2014   Results for orders placed or performed during the hospital encounter of 06/26/15 (from the past 72 hour(s))  Glucose, capillary     Status: None   Collection Time: 06/26/15 10:11 AM  Result Value Ref Range   Glucose-Capillary 81 65 - 99 mg/dL  Glucose, capillary     Status: None   Collection Time: 06/26/15 11:19 AM  Result Value Ref Range   Glucose-Capillary 78 65 - 99 mg/dL  Glucose, capillary     Status: None   Collection Time: 06/26/15 12:19 PM  Result Value Ref Range   Glucose-Capillary 74 65 - 99 mg/dL  Glucose, capillary     Status: None   Collection Time: 06/26/15  1:25 PM  Result Value Ref Range   Glucose-Capillary 70 65 - 99 mg/dL  Glucose, capillary     Status: None   Collection Time: 06/26/15  5:28 PM  Result Value Ref Range   Glucose-Capillary 70 65 - 99 mg/dL  Glucose, capillary     Status: Abnormal   Collection Time: 06/26/15 10:01  PM  Result Value Ref Range   Glucose-Capillary 116 (H) 65 - 99 mg/dL   Comment 1 Notify RN   Glucose, capillary     Status: Abnormal   Collection Time: 06/27/15  2:15 AM  Result Value Ref Range   Glucose-Capillary 109 (H) 65 - 99 mg/dL  Glucose, capillary     Status: Abnormal   Collection Time: 06/27/15  6:09 AM  Result Value Ref Range   Glucose-Capillary 102 (H) 65 - 99 mg/dL  Glucose, capillary     Status: None   Collection Time: 06/28/15  6:23 AM  Result  Value Ref Range   Glucose-Capillary 74 65 - 99 mg/dL   Comment 1 Notify RN    Comment 2 Document in Chart     Discharge Diagnoses: Term Pregnancy-delivered  Discharge Information: Date: 06/29/2015 Activity: pelvic rest including no sexual intercourse or tampons for 6 weeks Diet: routine  Medications: PNV, Ibuprofen and Colace Breast feeding:  Yes Condition: stable and improved Instructions: refer to handout. Return for increased pain, fever, bleeding, or discharge.  Discharge to: home  Follow up: call HD for appointment 4-6 weeks postpartum and for IUD. 2 hour GTT to be done at HD.        Discharge Instructions    Discharge patient    Complete by:  As directed             Medication List    STOP taking these medications        docusate sodium 100 MG capsule  Commonly known as:  COLACE      TAKE these medications        ACCU-CHEK FASTCLIX LANCETS Misc  1 each by Percutaneous route 4 (four) times daily. GDM O24.419 test 4 times daily     glucose blood test strip  Commonly known as:  ACCU-CHEK SMARTVIEW  DX Gestational Diabetes O24.419 for testing 4 times daily - If this is not the approved meter for her insurance, please provide appropriate glucometer and tsting supplies.     glyBURIDE 5 MG tablet  Commonly known as:  DIABETA  2 tablets in AM and 1.5 tablet in PM.     ibuprofen 600 MG tablet  Commonly known as:  ADVIL,MOTRIN  Take 1 tablet (600 mg total) by mouth every 6 (six) hours.     prenatal vitamin w/FE, FA 27-1 MG Tabs tablet  Take 1 tablet by mouth daily at 12 noon.     TUCKS 50 % Pads  Apply 1 application topically 4 (four) times daily as needed.       Follow-up Information    Follow up with [facility]. Schedule an appointment as soon as possible for a visit in 6 weeks.   Specialty:  Obstetrics and Gynecology   Why:  for postpartum visit   Contact information:   [address] [place] [address] [phone]       Arliss Journey, PA-S   06/29/2015,10:10 AM    Attestation of Attending Supervision of Physician Assistant Student: Evaluation and management procedures were performed by the PA Student under my supervision.  I have seen and examined the patient, reviewed the the student's note and chart, and I agree with management and plan.  Jaynie Collins, MD, FACOG Attending Obstetrician & Gynecologist Faculty Practice, [facility]

## 2015-06-29 NOTE — Discharge Instructions (Signed)

## 2015-06-30 ENCOUNTER — Ambulatory Visit: Payer: Self-pay

## 2015-06-30 NOTE — Lactation Note (Signed)
This note was copied from the chart of Carly Cabrera. Lactation Consultation Note  Patient Name: Carly Cabrera Date: 06/30/2015 Reason for consult: Follow-up assessment Baby 20 hours old. Mom set up with DEBP. Enc mom to pump now and give EBM after baby at breast. As mom started pumping, colostrum began to flow. Enc mom to feed with cues, supplement with EBM/Formula, and then post-pump. Discussed plan with patient's RN, Morrie Sheldon.  Maternal Data    Feeding Feeding Type: Breast Fed Length of feed: 20 min  LATCH Score/Interventions Latch: Grasps breast easily, tongue down, lips flanged, rhythmical sucking.  Audible Swallowing: A few with stimulation Intervention(s): Skin to skin;Hand expression  Type of Nipple: Everted at rest and after stimulation  Comfort (Breast/Nipple): Soft / non-tender  Interventions (Mild/moderate discomfort): Post-pump  Hold (Positioning): No assistance needed to correctly position infant at breast.  LATCH Score: 9  Lactation Tools Discussed/Used Pump Review: Setup, frequency, and cleaning;Milk Storage Initiated by:: JW Date initiated:: 06/30/15   Consult Status Consult Status: Follow-up Date: 07/01/15 Follow-up type: In-patient    Geralynn Ochs 06/30/2015, 2:57 PM

## 2015-06-30 NOTE — Lactation Note (Signed)
This note was copied from the chart of Carly Cabrera. Lactation Consultation Note Baby had a 12% wt. Loss. Mom has good colostrum. Baby has upper lip labial frenulum, upper lip curls upwards. Tongue frenulum anterior w/limited mobility. I feel the baby isn't getting the colostrum transfer. Mom has started supplementing w/formula recommended by RN d/t wt. Loss. Encouraged mom to BF, pump, give colostrum, then supplement if needed. On double photo therapy. Noted clear spit up in bed. Reported to RN and nursery of findings. Patient Name: Carly Hubert Derstine [id number] Date: 06/30/2015 Reason for consult: Follow-up assessment;Hyperbilirubinemia;Infant weight loss   Maternal Data    Feeding Feeding Type: Breast Milk with Formula added Nipple Type: Slow - flow Length of feed: 5 min  LATCH Score/Interventions                      Lactation Tools Discussed/Used Tools: Pump Breast pump type: Manual Pump Review: Setup, frequency, and cleaning;Milk Storage Initiated by:: RN Date initiated:: 06/29/15   Consult Status Consult Status: Follow-up Date: 06/30/15 Follow-up type: In-patient    Ryley Bachtel, Diamond Nickel 06/30/2015, 7:00 AM

## 2015-06-30 NOTE — Lactation Note (Signed)
This note was copied from the chart of [place]. Lactation Consultation Note  Patient Name: Carly Cabrera [id number] Date: 06/30/2015 Reason for consult: Follow-up assessment;Infant weight loss;Hyperbilirubinemia  Baby 66 hours old. Mom states that none of her other children were jaundiced and not of them had the same difficulties with BF that this baby is having. Discussed with mom that jaundice makes the baby sleepy, and enc mom to stimulate baby while nursing. Enc mom to nurse with cues and at least every 3 hours. Mom latched baby to left breast in cradle position and baby latched deeply with lips flanged and suckled in rhythmic bursts with some swallows noted. Mom denies any nipple pain with this latch, though she states she had some pain earlier. Baby nursed for 10 minutes, and then would not nurse any longer even with stimulation. Mom agreed to start post-pumping, but there is not a pump available at this time. Mom wanted to take a shower, so discussed the need for a pump with patient's RN, Caryl Pina. Put pump kit in mom's room and enc supplementing after each BF with EBM/formula. Enc mom to call for assistance as needed.  Maternal Data    Feeding Feeding Type: Breast Fed Length of feed: 10 min  LATCH Score/Interventions Latch: Grasps breast easily, tongue down, lips flanged, rhythmical sucking.  Audible Swallowing: A few with stimulation Intervention(s): Skin to skin;Hand expression  Type of Nipple: Everted at rest and after stimulation  Comfort (Breast/Nipple): Soft / non-tender  Interventions (Mild/moderate discomfort): Post-pump  Hold (Positioning): No assistance needed to correctly position infant at breast.  LATCH Score: 9  Lactation Tools Discussed/Used     Consult Status Consult Status: Follow-up Date: 07/01/15 Follow-up type: In-patient    Inocente Salles 06/30/2015, 12:05 PM

## 2015-07-01 ENCOUNTER — Ambulatory Visit: Payer: Self-pay

## 2015-07-01 NOTE — Lactation Note (Signed)
This note was copied from the chart of Carly Cabrera. Lactation Consultation Note: Mother sitting on the side of the bed attempting to latch infant in cradle hold. Mother assisted to chair for better support. Infant latched on in football hold. Infant sustained good depth. Mother's breast are filling. Observed infant feeding for 15-20 mins. Advised mother to post pump with the DEBP after each feeding for 20 mins. and supplement infant with EBM/Formula. Mother receptive to all teaching.  Patient Name: Carly Naomia Lenderman [id number] Date: 07/01/2015 Reason for consult: Follow-up assessment   Maternal Data    Feeding Feeding Type: Breast Fed Length of feed: 20 min  LATCH Score/Interventions Latch: Grasps breast easily, tongue down, lips flanged, rhythmical sucking.  Audible Swallowing: Spontaneous and intermittent  Type of Nipple: Everted at rest and after stimulation  Comfort (Breast/Nipple): Filling, red/small blisters or bruises, mild/mod discomfort  Problem noted: Filling  Hold (Positioning): Assistance needed to correctly position infant at breast and maintain latch.  LATCH Score: 8  Lactation Tools Discussed/Used     Consult Status Consult Status: Follow-up Date: 07/01/15 Follow-up type: In-patient    Stevan Born [facility] 07/01/2015, 12:27 PM

## 2015-07-02 ENCOUNTER — Ambulatory Visit: Payer: Self-pay

## 2015-07-02 NOTE — Lactation Note (Signed)
This note was copied from the chart of Carly Cabrera. Lactation Consultation Note: Mother states that infant is feeding well. Mother is supplementing infant with formula. Mothers breast are very full and firm. She states she was unable to get milk out with the electric pump after pumping 3 times yesterday. Advise mother to use hand pump. Mother pumped Rt breast and obtained 15ml in 5 mins. Infant placed to left breast. Observed good milk transfer. Mother has a [facility] appt on Friday July 22 at 8 am. Mother denies having any breastfeeding concerns.   Patient Name: Carly Cabrera [id number] Date: 07/02/2015 Reason for consult: Follow-up assessment   Maternal Data    Feeding Feeding Type: Breast Fed  LATCH Score/Interventions Latch: Grasps breast easily, tongue down, lips flanged, rhythmical sucking. Intervention(s): Adjust position;Breast compression  Audible Swallowing: Spontaneous and intermittent Intervention(s): Skin to skin;Hand expression  Type of Nipple: Everted at rest and after stimulation  Comfort (Breast/Nipple): Filling, red/small blisters or bruises, mild/mod discomfort  Problem noted: Filling Interventions (Filling): Hand pump Interventions (Mild/moderate discomfort): Hand massage;Hand expression;Pre-pump if needed;Post-pump  Hold (Positioning): No assistance needed to correctly position infant at breast.  LATCH Score: 9  Lactation Tools Discussed/Used     Consult Status Consult Status: Complete    Michel Bickers 07/02/2015, 10:40 AM

## 2015-07-28 ENCOUNTER — Ambulatory Visit (INDEPENDENT_AMBULATORY_CARE_PROVIDER_SITE_OTHER): Payer: BLUE CROSS/BLUE SHIELD | Admitting: Family

## 2015-07-28 ENCOUNTER — Encounter: Payer: Self-pay | Admitting: Family

## 2015-07-28 VITALS — BP 112/75 | HR 86 | Temp 98.4°F | Resp 18 | Wt 214.4 lb

## 2015-07-28 DIAGNOSIS — Z30014 Encounter for initial prescription of intrauterine contraceptive device: Secondary | ICD-10-CM | POA: Diagnosis not present

## 2015-07-28 DIAGNOSIS — Z8632 Personal history of gestational diabetes: Secondary | ICD-10-CM

## 2015-07-28 DIAGNOSIS — Z30430 Encounter for insertion of intrauterine contraceptive device: Secondary | ICD-10-CM

## 2015-07-28 DIAGNOSIS — Z3202 Encounter for pregnancy test, result negative: Secondary | ICD-10-CM | POA: Diagnosis not present

## 2015-07-28 LAB — POCT PREGNANCY, URINE: Preg Test, Ur: NEGATIVE

## 2015-07-28 MED ORDER — LEVONORGESTREL 18.6 MCG/DAY IU IUD
INTRAUTERINE_SYSTEM | Freq: Once | INTRAUTERINE | Status: AC
  Administered 2015-07-28: 1 via INTRAUTERINE

## 2015-07-28 NOTE — Patient Instructions (Signed)
Primary Care Resources: ° °  [facility]  °[address] [place] [address] [place] [phone] °Every 2nd Saturday 9am-12pm  °[email or web address] °FREE Services ° °  [facility]  °[address] [place] [phone]  °[address] [place] [address] [place] [phone]  °[email or web address] °$45 per visit/Walk-in only ° °  [facility]  °[address] [place] [address] [place] [phone]  °1st & 3rd Saturday of each month 9:30am-12:30pm °[email or web address] °Sliding fee scale/Call to make an appointment ° °  [facility]  °[address] [place] [phone]  °Hours Mon-Fri 9am-7pm & Sat 9am-1pm °[email or web address] °Visits start at $45 per visit/Call to make an appointment ° °  [facility] [place]  °[address] [place]  [address] [place] [phone]  °Hours Mon-Wed 8:30am-5pm & Thurs 8:30am-8pm °$5 per visit/Call for an eligibility appointment ° ° ° °

## 2015-07-28 NOTE — Addendum Note (Signed)
Addended by: Candelaria Stagers E on: 07/28/2015 10:23 AM   Modules accepted: Orders

## 2015-07-28 NOTE — Progress Notes (Signed)
Patient ID: Carly Cabrera, female   DOB: 19-Sep-1986, 29 y.o.   MRN: [id number] Subjective:     Carly Cabrera is a 29 y.o. female who presents for a postpartum visit. She is 4 weeks postpartum following a spontaneous vaginal delivery. I have fully reviewed the prenatal and intrapartum course. The delivery was at 39 gestational weeks. Outcome: spontaneous vaginal delivery. Anesthesia: none. Postpartum course has been Good. Baby's course has been Good. Baby is feeding by both breast and bottle - Similac Advance. Bleeding no bleeding. Bowel function is normal. Bladder function is normal. Patient is not sexually active. Contraception method is abstinence. Postpartum depression screening: negative.  The following portions of the patient's history were reviewed and updated as appropriate: allergies, current medications, past family history, past medical history, past social history, past surgical history and problem list.  Review of Systems Pertinent items are noted in HPI.   Objective:    BP 112/75 mmHg  Pulse 86  Temp(Src) 98.4 F (36.9 C) (Oral)  Resp 18  Wt 214 lb 7 oz (97.268 kg)        General:  alert, cooperative and appears stated age   Breasts:  inspection negative, no nipple discharge or bleeding, no masses or nodularity palpable  Lungs: clear to auscultation bilaterally  Heart:  regular rate and rhythm, S1, S2 normal, no murmur, click, rub or gallop  Abdomen: soft, non-tender; bowel sounds normal; no masses,  no organomegaly   Vulva:  normal  Vagina: normal vagina, no discharge, exudate, lesion, or erythema; healed well  Cervix:  no cervical motion tenderness  Corpus: normal size, contour, position, consistency, mobility, non-tender  Adnexa:  normal adnexa  Rectal Exam: Not performed.   Pt consented for IUD insertion.  Pt placed in lithotomy position.  Speculum inserted and cervix cleaned with betadine solution.  Uterus sounded to 6.5cm; IUD inserted without difficulty.  Strings  cut at approximately 3 cm.  Speculum removed.  Assessment:    Normal postpartum exam. Pap smear not done at today's visit.   Hx Elevated 1 hr glucola IUD Insertion  Plan:    1. Contraception: IUD 2. Schedule appt with PCP for screening for diabetes mellitis 3. Follow up in: 1 years or as needed.    Eino Farber Kennith Gain, CNM

## 2016-01-03 DIAGNOSIS — N939 Abnormal uterine and vaginal bleeding, unspecified: Secondary | ICD-10-CM | POA: Insufficient documentation

## 2016-12-09 NOTE — L&D Delivery Note (Signed)
31 y.o. [id number] at 3584w0d admitted for IOL for A2GDM delivered a viable female infant at 101706 in cephalic, ROA position after pitocin, and AROM. One nuchal cord, easily reduced after delivery. Left anterior shoulder delivered with ease. 60 sec delayed cord clamping. Cord clamped x2 and cut. Placenta delivered spontaneously intact, with 3VC. Fundus firm on exam with massage and pitocin. Good hemostasis noted.  Anesthesia: No epidural, local anesthesia for laceration repair Laceration: Second degree Suture: 3.0 Vicryl Good hemostasis noted. EBL: 50 cc  Mom and baby recovering in LDR.    Apgars: APGAR (1 MIN): 9  APGAR (5 MINS):  9 Weight: Pending skin to skin  Sponge and instrument count were correct x2. Placenta sent to L&D. Inpatient circumcision, Breast feeding, Nexplanon for contraception.  [place] Shirley, DO FM Resident PGY-1 06/15/2017 5:24 PM

## 2017-01-06 LAB — OB RESULTS CONSOLE GC/CHLAMYDIA
Chlamydia: NEGATIVE
Chlamydia: NEGATIVE
GC PROBE AMP, GENITAL: NEGATIVE
Gonorrhea: NEGATIVE

## 2017-01-06 LAB — OB RESULTS CONSOLE RPR: RPR: NONREACTIVE

## 2017-01-06 LAB — GLUCOSE, 1 HOUR: Glucose 1 Hr Prenatal, POC: 176 mg/dL

## 2017-01-06 LAB — OB RESULTS CONSOLE ABO/RH: RH Type: POSITIVE

## 2017-01-06 LAB — OB RESULTS CONSOLE VARICELLA ZOSTER ANTIBODY, IGG: VARICELLA IGG: IMMUNE

## 2017-01-06 LAB — OB RESULTS CONSOLE HEPATITIS B SURFACE ANTIGEN: HEP B S AG: NEGATIVE

## 2017-01-06 LAB — OB RESULTS CONSOLE HGB/HCT, BLOOD
HEMATOCRIT: 31 %
HEMOGLOBIN: 10.2 g/dL

## 2017-01-06 LAB — OB RESULTS CONSOLE PLATELET COUNT: Platelets: 251 10*3/uL

## 2017-01-06 LAB — OB RESULTS CONSOLE ANTIBODY SCREEN: Antibody Screen: NEGATIVE

## 2017-01-06 LAB — OB RESULTS CONSOLE RUBELLA ANTIBODY, IGM: RUBELLA: IMMUNE

## 2017-01-06 LAB — HEMOGLOBIN EVAL RFX ELECTROPHORESIS: HEMOGLOBIN EVALUATION: NORMAL

## 2017-01-06 LAB — OB RESULTS CONSOLE HIV ANTIBODY (ROUTINE TESTING): HIV: NONREACTIVE

## 2017-01-09 LAB — GLUCOSE, 3 HOUR

## 2017-01-17 ENCOUNTER — Encounter: Payer: Self-pay | Admitting: *Deleted

## 2017-01-20 ENCOUNTER — Ambulatory Visit: Payer: Self-pay | Admitting: *Deleted

## 2017-01-20 ENCOUNTER — Encounter: Payer: Medicaid Other | Attending: Obstetrics and Gynecology | Admitting: *Deleted

## 2017-01-20 ENCOUNTER — Ambulatory Visit: Payer: BLUE CROSS/BLUE SHIELD | Admitting: *Deleted

## 2017-01-20 DIAGNOSIS — Z3A00 Weeks of gestation of pregnancy not specified: Secondary | ICD-10-CM | POA: Diagnosis not present

## 2017-01-20 DIAGNOSIS — O24419 Gestational diabetes mellitus in pregnancy, unspecified control: Secondary | ICD-10-CM | POA: Diagnosis not present

## 2017-01-20 DIAGNOSIS — Z713 Dietary counseling and surveillance: Secondary | ICD-10-CM | POA: Insufficient documentation

## 2017-01-20 MED ORDER — ACCU-CHEK GUIDE W/DEVICE KIT: 1.0000 | PACK | Freq: Four times a day (QID) | 0 refills | Status: DC

## 2017-01-20 MED ORDER — GLUCOSE BLOOD VI STRP: ORAL_STRIP | 12 refills | Status: DC

## 2017-01-20 MED ORDER — ACCU-CHEK FASTCLIX LANCET KIT
1.0000 | PACK | Freq: Four times a day (QID) | 12 refills | Status: AC
Start: 2017-01-20 — End: 2017-02-19

## 2017-01-20 NOTE — Addendum Note (Signed)
Addended by: Sherre LainASH, Tiquan Bouch A on: 01/20/2017 04:58 PM   Modules accepted: Orders

## 2017-01-20 NOTE — Progress Notes (Signed)
  Patient was seen on 01/20/2017 for Gestational Diabetes self-management . She states this is her 5th child, EDD 06/16/2017. She also states history of GDM with her last pregnancy in 2016.  The following learning objectives were met by the patient :   States the definition of Gestational Diabetes  States why dietary management is important in controlling blood glucose  Describes the effects of carbohydrates on blood glucose levels  Demonstrates ability to create a balanced meal plan  Demonstrates carbohydrate counting   States when to check blood glucose levels  Demonstrates proper blood glucose monitoring techniques  States the effect of stress and exercise on blood glucose levels  States the importance of limiting caffeine and abstaining from alcohol and smoking  Plan:  Aim for 2-3 Carb Choices per meal (30-45 grams)   Aim for 1-2 Carbs per snack Begin reading food labels for Total Carbohydrate of foods Consider  increasing your activity level by walking or other activity daily as tolerated Begin checking BG before breakfast and 2 hours after first bite of breakfast, lunch and dinner as directed by MD  Take medication if directed by MD, she is not on Diabetes medication at this time.  Blood glucose monitor for Accu Chek Guide with Fast Clix [facility] called into [place]  Patient instructed to test pre breakfast and 2 hours each meal as directed by MD Bring Log Book to every medical appointment   Patient instructed to monitor glucose levels: FBS: 60 - <90 2 hour: <120  Patient received the following handouts:  Nutrition Diabetes and Pregnancy  Carbohydrate Counting List  BG Log Sheet in case Log Book not included in BG starter kit  Patient will be seen for follow-up as needed.

## 2017-01-22 DIAGNOSIS — O280 Abnormal hematological finding on antenatal screening of mother: Secondary | ICD-10-CM

## 2017-01-22 DIAGNOSIS — R7611 Nonspecific reaction to tuberculin skin test without active tuberculosis: Secondary | ICD-10-CM | POA: Insufficient documentation

## 2017-01-22 DIAGNOSIS — O0990 Supervision of high risk pregnancy, unspecified, unspecified trimester: Secondary | ICD-10-CM

## 2017-01-22 DIAGNOSIS — O24419 Gestational diabetes mellitus in pregnancy, unspecified control: Secondary | ICD-10-CM

## 2017-01-27 ENCOUNTER — Ambulatory Visit (INDEPENDENT_AMBULATORY_CARE_PROVIDER_SITE_OTHER): Payer: Medicaid Other | Admitting: Obstetrics & Gynecology

## 2017-01-27 ENCOUNTER — Encounter: Payer: Self-pay | Admitting: Obstetrics & Gynecology

## 2017-01-27 VITALS — BP 111/73 | HR 92

## 2017-01-27 DIAGNOSIS — Z641 Problems related to multiparity: Secondary | ICD-10-CM | POA: Diagnosis not present

## 2017-01-27 DIAGNOSIS — R7611 Nonspecific reaction to tuberculin skin test without active tuberculosis: Secondary | ICD-10-CM | POA: Diagnosis not present

## 2017-01-27 DIAGNOSIS — O280 Abnormal hematological finding on antenatal screening of mother: Secondary | ICD-10-CM

## 2017-01-27 DIAGNOSIS — O24419 Gestational diabetes mellitus in pregnancy, unspecified control: Secondary | ICD-10-CM | POA: Diagnosis present

## 2017-01-27 DIAGNOSIS — O0990 Supervision of high risk pregnancy, unspecified, unspecified trimester: Secondary | ICD-10-CM

## 2017-01-27 LAB — POCT URINALYSIS DIP (DEVICE)
Bilirubin Urine: NEGATIVE
GLUCOSE, UA: 500 mg/dL — AB
Hgb urine dipstick: NEGATIVE
Leukocytes, UA: NEGATIVE
Nitrite: NEGATIVE
PH: 6 (ref 5.0–8.0)
PROTEIN: NEGATIVE mg/dL
UROBILINOGEN UA: 1 mg/dL (ref 0.0–1.0)

## 2017-01-27 MED ORDER — GLYBURIDE 1.25 MG PO TABS: 2.5000 mg | ORAL_TABLET | Freq: Every day | ORAL | 3 refills | Status: DC

## 2017-01-27 NOTE — Progress Notes (Signed)
New ob packet given  OB US scheduled for March 1st @ 0845 .  Pt notified.     PRENATAL VISIT NOTE  Subjective:  Carly Cabrera is a 31 y.o. G5P4004 at [redacted]w[redacted]d being seen today for ongoing prenatal care, bur first prental visit with us.  She is transfer from [facility] for GDM dx in 2nd trimester  She is currently monitored for the following issues for this high-risk pregnancy and has History of gestational diabetes; Gestational diabetes mellitus (GDM) affecting pregnancy; Supervision of high-risk pregnancy; Positive TB test; Abnormal quad screen; and Multiparity on her problem list.  Patient reports no complaints.  Contractions: Irritability. Vag. Bleeding: None.  Movement: Present. Denies leaking of fluid.   The following portions of the patient's history were reviewed and updated as appropriate: allergies, current medications, past family history, past medical history, past social history, past surgical history and problem list. Problem list updated.  Objective:   Vitals:   01/27/17 0930  BP: 111/73  Pulse: 92    Fetal Status: Fetal Heart Rate (bpm): 139   Movement: Present     General:  Alert, oriented and cooperative. Patient is in no acute distress.  Skin: Skin is warm and dry. No rash noted.   Cardiovascular: Normal heart rate noted  Respiratory: Normal respiratory effort, no problems with respiration noted  Abdomen: Soft, gravid, appropriate for gestational age. Pain/Pressure: Absent     Pelvic:  Cervical exam deferred        Extremities: Normal range of motion.  Edema: None  Mental Status: Normal mood and affect. Normal behavior. Normal judgment and thought content.   Assessment and Plan:  Pregnancy: G5P4004 at [redacted]w[redacted]d  1. Supervision of high risk pregnancy, antepartu - US MFM OB DETAIL +14 WK; Future   2. Positive TB test Pt has finished treatment (at [facility])  3. Multiparity Considering BTL; needs to talk with husband  4. Gestational diabetes mellitus (GDM) affecting  pregnancy Fasting are high, will start glyburide at night.  2 hr pp dinner are 120s.   Will watch closely and start morning glyburide as needed Hgb A1C.  If elevated will treat as type 2 and get fetal echo. - TSH - Protein / creatinine ratio, urine - Hemoglobin A1c  5. Abnormal quad screen Based on LMP dating.  Pt should get MFM detailed US to confirm dates and genetic counseling (This information was relayed to scheduling staff at MFM by Drucie IpJ Belamy, RN)  Preterm labor symptoms and general obstetric precautions including but not limited to vaginal bleeding, contractions, leaking of fluid and fetal movement were reviewed in detail with the patient. Please refer to After Visit Summary for other counseling recommendations.  Return in about 2 weeks (around 02/10/2017).  40 minutes spent face to face with >50% counseling.  Lesly DukesKelly H Leggett, MD

## 2017-01-28 ENCOUNTER — Other Ambulatory Visit: Payer: Self-pay

## 2017-01-28 LAB — TSH: TSH: 0.594 u[IU]/mL (ref 0.450–4.500)

## 2017-01-28 LAB — HEMOGLOBIN A1C
Est. average glucose Bld gHb Est-mCnc: 146 mg/dL
Hgb A1c MFr Bld: 6.7 % — ABNORMAL HIGH (ref 4.8–5.6)

## 2017-01-28 LAB — PROTEIN / CREATININE RATIO, URINE

## 2017-01-31 ENCOUNTER — Ambulatory Visit (HOSPITAL_COMMUNITY): Payer: Self-pay

## 2017-02-05 ENCOUNTER — Other Ambulatory Visit: Payer: Self-pay

## 2017-02-05 DIAGNOSIS — O280 Abnormal hematological finding on antenatal screening of mother: Secondary | ICD-10-CM

## 2017-02-05 NOTE — Progress Notes (Signed)
Kim from [facility] called requested that pt needed to have genetic counseling due to abnormal QUAD and an order needed to be placed.  Order placed as requested.

## 2017-02-06 ENCOUNTER — Other Ambulatory Visit: Payer: Self-pay | Admitting: Obstetrics & Gynecology

## 2017-02-06 ENCOUNTER — Ambulatory Visit (HOSPITAL_COMMUNITY)
Admission: RE | Admit: 2017-02-06 | Discharge: 2017-02-06 | Disposition: A | Discharge status: Home / Self Care | Payer: Medicaid Other | Source: Ambulatory Visit | Attending: Obstetrics & Gynecology | Admitting: Obstetrics & Gynecology

## 2017-02-06 ENCOUNTER — Encounter (HOSPITAL_COMMUNITY): Payer: Self-pay

## 2017-02-06 DIAGNOSIS — Z363 Encounter for antenatal screening for malformations: Secondary | ICD-10-CM

## 2017-02-06 DIAGNOSIS — Z3A21 21 weeks gestation of pregnancy: Secondary | ICD-10-CM | POA: Insufficient documentation

## 2017-02-06 DIAGNOSIS — O281 Abnormal biochemical finding on antenatal screening of mother: Secondary | ICD-10-CM

## 2017-02-06 DIAGNOSIS — O283 Abnormal ultrasonic finding on antenatal screening of mother: Secondary | ICD-10-CM | POA: Diagnosis not present

## 2017-02-06 DIAGNOSIS — Z315 Encounter for genetic counseling: Secondary | ICD-10-CM | POA: Diagnosis not present

## 2017-02-06 DIAGNOSIS — O24415 Gestational diabetes mellitus in pregnancy, controlled by oral hypoglycemic drugs: Secondary | ICD-10-CM | POA: Diagnosis not present

## 2017-02-06 DIAGNOSIS — O280 Abnormal hematological finding on antenatal screening of mother: Secondary | ICD-10-CM

## 2017-02-06 DIAGNOSIS — O0990 Supervision of high risk pregnancy, unspecified, unspecified trimester: Secondary | ICD-10-CM

## 2017-02-06 HISTORY — DX: Gestational diabetes mellitus in pregnancy, unspecified control: O24.419

## 2017-02-06 NOTE — Progress Notes (Signed)
Genetic Counseling  High-Risk Gestation Note  Appointment Date:  02/06/2017 Referred By: Lesly Dukes, MD Date of Birth:  March 22, 1986 Partner: Carly Cabrera   Pregnancy History: [id number] Estimated Date of Delivery: 06/15/17 Estimated Gestational Age: [redacted]w[redacted]d Attending: Particia Nearing, MD    [name] was seen for genetic counseling because of an increased risk for fetal Down syndrome based on Quad screen through [facility]  In summary:  Reviewed results of Quad screening test  Increased risk for down syndrome (1 in 107)  Discussed additional screening options  NIPS- [place] drawn at Dr. Tawni Levy office on 01/28/17, results currently pending  Ultrasound- performed today, within normal limits  Discussed diagnostic testing options  Amniocentesis-declined  Reviewed family history concerns  She was/ counseled regarding the screening result and the associated 1 in 107 risk for fetal Down syndrome.  We reviewed chromosomes, nondisjunction, and the common features and variable prognosis of Down syndrome.  In addition, we reviewed the screen adjusted reduction in risks for trisomy 18 and open neural tube defects.  We also discussed other explanations for a screen positive result including: a gestational dating error, differences in maternal metabolism, and normal variation.  We reviewed other available screening options including noninvasive prenatal screening (NIPS)/cell free DNA (cfDNA) screening, and detailed ultrasound.  She was counseled that screening tests are used to modify a patient's a priori risk for aneuploidy, typically based on age. This estimate provides a pregnancy specific risk assessment. We reviewed the benefits and limitations of each option. Specifically, we discussed the conditions for which each test screens, the detection rates, and false positive rates of each. She was also counseled regarding diagnostic testing via amniocentesis. We  reviewed the approximate 1 in 300-500 risk for complications from amniocentesis, including spontaneous pregnancy loss. We discussed the possible results that the tests might provide including: positive, negative, unanticipated, and no result. Finally, they were counseled regarding the cost of each option and potential out of pocket expenses.   OB records indicated that NIPS ([facility] through [place]) was drawn at Dr. Tawni Levy office on 2/20, and the patient confirmed having blood drawn on 2/20. Results were not available to [place] at the time of today's visit.   A complete ultrasound was performed today. The ultrasound report will be sent under separate cover. There were no visualized fetal anomalies or markers suggestive of aneuploidy. Diagnostic testing was declined today.  She understands that screening tests cannot rule out all birth defects or genetic syndromes. The patient was advised of this limitation and states she still does not want additional testing at this time.   Ms Carly Cabrera  was provided with written information regarding cystic fibrosis (CF), spinal muscular atrophy (SMA) and hemoglobinopathies including the carrier frequency, availability of carrier screening and prenatal diagnosis if indicated.  In addition, we discussed that CF and hemoglobinopathies are routinely screened for as part of the [place] newborn screening panel.  She declined screening for CF, SMA and hemoglobinopathies.   Both family histories were reviewed and found to be noncontributory for birth defects, intellectual disability, recurrent pregnancy loss, or known genetic conditions. Without further information regarding the provided family history, an accurate genetic risk cannot be calculated. Further genetic counseling is warranted if more information is obtained.  [name] denied exposure to environmental toxins or chemical agents. She denied the use of alcohol, tobacco or street drugs. She denied significant  viral illnesses during the course of her pregnancy. Her medical and surgical histories were  contributory for gestational diabetes, which is being managed by her OB.   I counseled [name]  for approximately 40 minutes regarding the above risks and available options.   [name], MS,  Certified Genetic Counselor  02/06/2017

## 2017-02-07 ENCOUNTER — Telehealth (HOSPITAL_COMMUNITY): Payer: Self-pay | Admitting: MS"

## 2017-02-07 NOTE — Telephone Encounter (Signed)
Called Carly Cabrera to discuss her prenatal cell free DNA test results.  [name] had Harmony drawn through Dr. Tawni Levyorn's office.  Testing was offered because of abnormal maternal serum screen.   The patient was identified by name and DOB.  We reviewed that these are within normal limits, showing a less than 1 in 10,000 risk for trisomies 21, 18 and 13.  She understands that this testing does not identify all genetic conditions.  All questions were answered to her satisfaction, she was encouraged to call with additional questions or concerns.  Quinn PlowmanKaren Artelia Game, MS Patent attorneyCertified Genetic Counselor

## 2017-02-10 ENCOUNTER — Ambulatory Visit (INDEPENDENT_AMBULATORY_CARE_PROVIDER_SITE_OTHER): Payer: Medicaid Other | Admitting: Obstetrics & Gynecology

## 2017-02-10 VITALS — BP 117/71 | HR 89 | Wt 222.0 lb

## 2017-02-10 DIAGNOSIS — O24419 Gestational diabetes mellitus in pregnancy, unspecified control: Secondary | ICD-10-CM | POA: Diagnosis not present

## 2017-02-10 DIAGNOSIS — O0990 Supervision of high risk pregnancy, unspecified, unspecified trimester: Secondary | ICD-10-CM

## 2017-02-10 MED ORDER — GLYBURIDE 2.5 MG PO TABS: 2.5000 mg | ORAL_TABLET | Freq: Every day | ORAL | 30 refills | Status: DC

## 2017-02-10 NOTE — Patient Instructions (Signed)

## 2017-02-10 NOTE — Progress Notes (Signed)
   PRENATAL VISIT NOTE  Subjective:  Carly Cabrera is a 31 y.o. G5P4004 at 2831w1d being seen today for ongoing prenatal care.  She is currently monitored for the following issues for this high-risk pregnancy and has History of gestational diabetes; Gestational diabetes mellitus (GDM) affecting pregnancy; Supervision of high-risk pregnancy; Positive TB test; Abnormal quad screen; Multiparity; and [redacted] weeks gestation of pregnancy on her problem list.  Patient reports no complaints.  Contractions: Not present. Vag. Bleeding: None.  Movement: Present. Denies leaking of fluid.   The following portions of the patient's history were reviewed and updated as appropriate: allergies, current medications, past family history, past medical history, past social history, past surgical history and problem list. Problem list updated.  Objective:   Vitals:   02/10/17 1024  BP: 117/71  Pulse: 89  Weight: 222 lb (100.7 kg)    Fetal Status: Fetal Heart Rate (bpm): 152   Movement: Present     General:  Alert, oriented and cooperative. Patient is in no acute distress.  Skin: Skin is warm and dry. No rash noted.   Cardiovascular: Normal heart rate noted  Respiratory: Normal respiratory effort, no problems with respiration noted  Abdomen: Soft, gravid, appropriate for gestational age. Pain/Pressure: Present     Pelvic:  Cervical exam deferred        Extremities: Normal range of motion.  Edema: None  Mental Status: Normal mood and affect. Normal behavior. Normal judgment and thought content.   Assessment and Plan:  Pregnancy: G5P4004 at 4131w1d  1. Gestational diabetes mellitus (GDM) affecting pregnancy FBS >75% below 90, PP most are WNL occasional above 120  2. Supervision of high risk pregnancy, antepartum Continue glyburide   Preterm labor symptoms and general obstetric precautions including but not limited to vaginal bleeding, contractions, leaking of fluid and fetal movement were reviewed in detail  with the patient. Please refer to After Visit Summary for other counseling recommendations.  Return in about 2 weeks (around 02/24/2017).   Adam PhenixJames G Arnold, MD

## 2017-02-24 ENCOUNTER — Ambulatory Visit (INDEPENDENT_AMBULATORY_CARE_PROVIDER_SITE_OTHER): Payer: Medicaid Other | Admitting: Obstetrics & Gynecology

## 2017-02-24 VITALS — BP 107/67 | HR 70 | Wt 219.8 lb

## 2017-02-24 DIAGNOSIS — O24419 Gestational diabetes mellitus in pregnancy, unspecified control: Secondary | ICD-10-CM

## 2017-02-24 DIAGNOSIS — O280 Abnormal hematological finding on antenatal screening of mother: Secondary | ICD-10-CM | POA: Diagnosis not present

## 2017-02-24 DIAGNOSIS — O0992 Supervision of high risk pregnancy, unspecified, second trimester: Secondary | ICD-10-CM

## 2017-02-24 LAB — POCT URINALYSIS DIP (DEVICE)
Glucose, UA: NEGATIVE mg/dL
HGB URINE DIPSTICK: NEGATIVE
Ketones, ur: NEGATIVE mg/dL
NITRITE: NEGATIVE
PH: 6 (ref 5.0–8.0)
Protein, ur: 30 mg/dL — AB
Urobilinogen, UA: 1 mg/dL (ref 0.0–1.0)

## 2017-02-24 NOTE — Progress Notes (Signed)
   PRENATAL VISIT NOTE  Subjective:  Carly Cabrera is a 31 y.o. G5P4004 at [redacted]w[redacted]d being seen today for ongoing prenatal care.  She is currently monitored for the following issues for this high-risk pregnancy and has History of gestational diabetes; Gestational diabetes mellitus (GDM) affecting pregnancy; Supervision of high-risk pregnancy; Positive TB test; Abnormal quad screen; and Multiparity on her problem list.  Patient reports rare headache and feeling dizzy.  Urine is dark yellow.  Neesd to increase water intake.  No low CBG on log.  Contractions: Not present. Vag. Bleeding: None.  Movement: Present. Denies leaking of fluid.   The following portions of the patient's history were reviewed and updated as appropriate: allergies, current medications, past family history, past medical history, past social history, past surgical history and problem list. Problem list updated.  Objective:   Vitals:   02/24/17 1055  BP: 107/67  Pulse: 70  Weight: 219 lb 12.8 oz (99.7 kg)    Fetal Status: Fetal Heart Rate (bpm): 158   Movement: Present     General:  Alert, oriented and cooperative. Patient is in no acute distress.  Skin: Skin is warm and dry. No rash noted.   Cardiovascular: Normal heart rate noted  Respiratory: Normal respiratory effort, no problems with respiration noted  Abdomen: Soft, gravid, appropriate for gestational age. Pain/Pressure: Present     Pelvic:  Cervical exam deferred        Extremities: Normal range of motion.  Edema: None  Mental Status: Normal mood and affect. Normal behavior. Normal judgment and thought content.   Assessment and Plan:  Pregnancy: G5P4004 at 323w1d  1. Gestational diabetes mellitus (GDM) affecting pregnancy -Rare 150 after dinner.  Most all are within normal limits. -Q 4 week US  2. Supervision of high risk pregnancy in second trimester -NO labs today  3. Abnormal quad screen NIPS negative.  Preterm labor symptoms and general obstetric  precautions including but not limited to vaginal bleeding, contractions, leaking of fluid and fetal movement were reviewed in detail with the patient. Please refer to After Visit Summary for other counseling recommendations.  Return in about 3 weeks (around 03/17/2017).   Lesly DukesKelly H Leggett, MD

## 2017-03-17 ENCOUNTER — Ambulatory Visit (INDEPENDENT_AMBULATORY_CARE_PROVIDER_SITE_OTHER): Payer: Medicaid Other | Admitting: Family Medicine

## 2017-03-17 VITALS — BP 117/75 | HR 91 | Wt 222.2 lb

## 2017-03-17 DIAGNOSIS — O0990 Supervision of high risk pregnancy, unspecified, unspecified trimester: Secondary | ICD-10-CM

## 2017-03-17 DIAGNOSIS — O280 Abnormal hematological finding on antenatal screening of mother: Secondary | ICD-10-CM

## 2017-03-17 DIAGNOSIS — R7611 Nonspecific reaction to tuberculin skin test without active tuberculosis: Secondary | ICD-10-CM

## 2017-03-17 DIAGNOSIS — O24419 Gestational diabetes mellitus in pregnancy, unspecified control: Secondary | ICD-10-CM | POA: Diagnosis not present

## 2017-03-17 LAB — POCT URINALYSIS DIP (DEVICE)
BILIRUBIN URINE: NEGATIVE
Glucose, UA: 500 mg/dL — AB
NITRITE: NEGATIVE
PH: 6 (ref 5.0–8.0)
PROTEIN: 30 mg/dL — AB
Specific Gravity, Urine: 1.025 (ref 1.005–1.030)
UROBILINOGEN UA: 1 mg/dL (ref 0.0–1.0)

## 2017-03-17 MED ORDER — GLYBURIDE 2.5 MG PO TABS: 5.0000 mg | ORAL_TABLET | Freq: Every day | ORAL | 3 refills | Status: DC

## 2017-03-17 MED ORDER — TETANUS-DIPHTH-ACELL PERTUSSIS 5-2.5-18.5 LF-MCG/0.5 IM SUSP: 0.5000 mL | Freq: Once | INTRAMUSCULAR | Status: DC

## 2017-03-17 MED ORDER — ASPIRIN EC 81 MG PO TBEC: 81.0000 mg | DELAYED_RELEASE_TABLET | Freq: Every day | ORAL | 3 refills | Status: DC

## 2017-03-17 MED ORDER — ACCU-CHEK FASTCLIX LANCETS MISC: 12 refills | Status: DC

## 2017-03-17 MED ORDER — GLUCOSE BLOOD VI STRP: ORAL_STRIP | 12 refills | Status: DC

## 2017-03-17 NOTE — Progress Notes (Signed)
28 wk packet given; tdap vaccine today   Fetal Echo scheduled for May 3rd @ 1030.  Pt notified.  Front office to fax referral info via EPIC.

## 2017-03-17 NOTE — Progress Notes (Signed)
   Subjective:  Carly Cabrera is a 31 y.o. G5P4004 at [redacted]w[redacted]d being seen today for ongoing prenatal care.  She is currently monitored for the following issues for this high-risk pregnancy and has History of gestational diabetes; Gestational diabetes mellitus (GDM) affecting pregnancy; Supervision of high-risk pregnancy; Positive TB test; Abnormal quad screen; and Multiparity on her problem list.  Patient reports no complaints.  Contractions: Not present. Vag. Bleeding: None.  Movement: Present. Denies leaking of fluid.   The following portions of the patient's history were reviewed and updated as appropriate: allergies, current medications, past family history, past medical history, past social history, past surgical history and problem list. Problem list updated.  Objective:   Vitals:   03/17/17 0920  BP: 117/75  Pulse: 91  Weight: 222 lb 3.2 oz (100.8 kg)    Fetal Status: Fetal Heart Rate (bpm): 141   Movement: Present     General:  Alert, oriented and cooperative. Patient is in no acute distress.  Skin: Skin is warm and dry. No rash noted.   Cardiovascular: Normal heart rate noted  Respiratory: Normal respiratory effort, no problems with respiration noted  Abdomen: Soft, gravid, appropriate for gestational age. Pain/Pressure: Present     Pelvic:  Cervical exam deferred        Extremities: Normal range of motion.  Edema: None  Mental Status: Normal mood and affect. Normal behavior. Normal judgment and thought content.   Urinalysis: Urine Protein: 1+ Urine Glucose: 3+       Assessment and Plan:  Pregnancy: G5P4004 at [redacted]w[redacted]d  1. Supervision of high risk pregnancy, antepartum - TDaP today - RPR - HIV antibody (with reflex) - CBC - Routine OB visit normal, CBG normal, Rx refilled  2. Gestational diabetes mellitus (GDM) affecting pregnancy - Most CBG controlled, a few outliers but majority WNL. Continue glyburide, states she is taking 2 pills (total ) at night. Refilled  for lancets, test strips, and glyburide. Discussed with patient to not run out and call the office if she needs refills or go get her refills at the pharmacy, there should be refills for the duration of her pregnancy. - Starting patient on  ASA - Fetal ECHO called in today, patient to have performed, due to treated as a GDM-A2/B (likely DM prior to pregnancy)  3. Abnormal quad screen - NIPS WNL  4. Positive TB test - CXR in 2016 negative  Preterm labor symptoms and general obstetric precautions including but not limited to vaginal bleeding, contractions, leaking of fluid and fetal movement were reviewed in detail with the patient. Please refer to After Visit Summary for other counseling recommendations.  Return in about 4 weeks (around 04/14/2017) for Routine OB visit.   Michaele Offer, DO OB Fellow [facility] for [facility], [facility]

## 2017-03-17 NOTE — Patient Instructions (Signed)
Third Trimester of Pregnancy The third trimester is from week 29 through week 42, months 7 through 9. This trimester is when your unborn baby (fetus) is growing very fast. At the end of the ninth month, the unborn baby is about 20 inches in length. It weighs about 6-10 pounds. Follow these instructions at home:  Avoid all smoking, herbs, and alcohol. Avoid drugs not approved by your doctor.  Do not use any tobacco products, including cigarettes, chewing tobacco, and electronic cigarettes. If you need help quitting, ask your doctor. You may get counseling or other support to help you quit.  Only take medicine as told by your doctor. Some medicines are safe and some are not during pregnancy.  Exercise only as told by your doctor. Stop exercising if you start having cramps.  Eat regular, healthy meals.  Wear a good support bra if your breasts are tender.  Do not use hot tubs, steam rooms, or saunas.  Wear your seat belt when driving.  Avoid raw meat, uncooked cheese, and liter boxes and soil used by cats.  Take your prenatal vitamins.  Take 1500-2000 milligrams of calcium daily starting at the 20th week of pregnancy until you deliver your baby.  Try taking medicine that helps you poop (stool softener) as needed, and if your doctor approves. Eat more fiber by eating fresh fruit, vegetables, and whole grains. Drink enough fluids to keep your pee (urine) clear or pale yellow.  Take warm water baths (sitz baths) to soothe pain or discomfort caused by hemorrhoids. Use hemorrhoid cream if your doctor approves.  If you have puffy, bulging veins (varicose veins), wear support hose. Raise (elevate) your feet for 15 minutes, 3-4 times a day. Limit salt in your diet.  Avoid heavy lifting, wear low heels, and sit up straight.  Rest with your legs raised if you have leg cramps or low back pain.  Visit your dentist if you have not gone during your pregnancy. Use a soft toothbrush to brush your  teeth. Be gentle when you floss.  You can have sex (intercourse) unless your doctor tells you not to.  Do not travel far distances unless you must. Only do so with your doctor's approval.  Take prenatal classes.  Practice driving to the hospital.  Pack your hospital bag.  Prepare the baby's room.  Go to your doctor visits. Get help if:  You are not sure if you are in labor or if your water has broken.  You are dizzy.  You have mild cramps or pressure in your lower belly (abdominal).  You have a nagging pain in your belly area.  You continue to feel sick to your stomach (nauseous), throw up (vomit), or have watery poop (diarrhea).  You have bad smelling fluid coming from your vagina.  You have pain with peeing (urination). Get help right away if:  You have a fever.  You are leaking fluid from your vagina.  You are spotting or bleeding from your vagina.  You have severe belly cramping or pain.  You lose or gain weight rapidly.  You have trouble catching your breath and have chest pain.  You notice sudden or extreme puffiness (swelling) of your face, hands, ankles, feet, or legs.  You have not felt the baby move in over an hour.  You have severe headaches that do not go away with medicine.  You have vision changes. This information is not intended to replace advice given to you by your health care provider. Make   sure you discuss any questions you have with your health care provider. Document Released: 02/19/2010 Document Revised: 05/02/2016 Document Reviewed: 01/26/2013 Elsevier Interactive Patient Education  2017 Else[employer] Inc.   Gestational Diabetes Mellitus, Diagnosis Gestational diabetes (gestational diabetes mellitus) is a short-term (temporary) form of diabetes that can happen during pregnancy. It goes away after you give birth. It may be caused by one or both of these problems:  Your body does not make enough of a hormone called insulin.  Your body  does not respond in a normal way to insulin that it makes. Insulin lets sugars (glucose) go into cells in the body. This gives you energy. If you have diabetes, sugars cannot get into cells. This causes high blood sugar (hyperglycemia). If diabetes is treated, it may not hurt you or your baby. Your doctor will set treatment goals for you. In general, you should have these blood sugar levels:  After not eating for a long time (fasting): 95 mg/dL (5.3 mmol/L).  After meals (postprandial):  One hour after a meal: at or below 140 mg/dL (7.8 mmol/L).  Two hours after a meal: at or below 120 mg/dL (6.7 mmol/L).  A1c (hemoglobin A1c) level: 6-6.5%. Follow these instructions at home: Questions to Ask Your Doctor   You may want to ask these questions:  Do I need to meet with a diabetes educator?  Where can I find a support group for people with diabetes?  What equipment will I need to care for myself at home?  What diabetes medicines do I need? When should I take them?  How often do I need to check my blood sugar?  What number can I call if I have questions?  When is my next doctor's visit? General instructions   Take over-the-counter and prescription medicines only as told by your doctor.  Stay at a healthy weight during pregnancy.  Keep all follow-up visits as told by your doctor. This is important. Contact a doctor if:  Your blood sugar is at or above 240 mg/dL (16.1 mmol/L).  Your blood sugar is at or above 200 mg/dL (09.6 mmol/L) and you have ketones in your pee (urine).  You have been sick or have had a fever for 2 days or more and you are not getting better.  You have any of these problems for more than 6 hours:  You cannot eat or drink.  You feel sick to your stomach (nauseous).  You throw up (vomit).  You have watery poop (diarrhea). Get help right away if:  Your blood sugar is lower than 54 mg/dL (3 mmol/L).  You get confused.  You have  trouble:  Thinking clearly.  Breathing.  Your baby moves less than normal.  You have:  Moderate or large ketone levels in your pee (urine).  Bleeding from your vagina.  Unusual fluid coming from your vagina.  Early contractions. These may feel like tightness in your belly. This information is not intended to replace advice given to you by your health care provider. Make sure you discuss any questions you have with your health care provider. Document Released: 03/18/2016 Document Revised: 05/02/2016 Document Reviewed: 12/29/2015 Elsevier Interactive Patient Education  2017 [employer].

## 2017-03-18 LAB — CBC
HEMOGLOBIN: 10 g/dL — AB (ref 11.1–15.9)
Hematocrit: 30.8 % — ABNORMAL LOW (ref 34.0–46.6)
MCH: 24.3 pg — ABNORMAL LOW (ref 26.6–33.0)
MCHC: 32.5 g/dL (ref 31.5–35.7)
MCV: 75 fL — ABNORMAL LOW (ref 79–97)
Platelets: 255 10*3/uL (ref 150–379)
RBC: 4.11 x10E6/uL (ref 3.77–5.28)
RDW: 16.7 % — ABNORMAL HIGH (ref 12.3–15.4)
WBC: 7 10*3/uL (ref 3.4–10.8)

## 2017-03-18 LAB — RPR: RPR: NONREACTIVE

## 2017-03-18 LAB — HIV ANTIBODY (ROUTINE TESTING W REFLEX): HIV SCREEN 4TH GENERATION: NONREACTIVE

## 2017-03-20 ENCOUNTER — Other Ambulatory Visit (HOSPITAL_COMMUNITY): Payer: Self-pay | Admitting: *Deleted

## 2017-03-20 ENCOUNTER — Encounter (HOSPITAL_COMMUNITY): Payer: Self-pay

## 2017-03-20 ENCOUNTER — Other Ambulatory Visit (HOSPITAL_COMMUNITY): Payer: Self-pay | Admitting: Maternal and Fetal Medicine

## 2017-03-20 ENCOUNTER — Ambulatory Visit (HOSPITAL_COMMUNITY)
Admission: RE | Admit: 2017-03-20 | Discharge: 2017-03-20 | Disposition: A | Discharge status: Home / Self Care | Payer: Medicaid Other | Source: Ambulatory Visit | Attending: Obstetrics & Gynecology | Admitting: Obstetrics & Gynecology

## 2017-03-20 DIAGNOSIS — O24415 Gestational diabetes mellitus in pregnancy, controlled by oral hypoglycemic drugs: Secondary | ICD-10-CM | POA: Diagnosis present

## 2017-03-20 DIAGNOSIS — O288 Other abnormal findings on antenatal screening of mother: Secondary | ICD-10-CM | POA: Insufficient documentation

## 2017-03-20 DIAGNOSIS — Z3A27 27 weeks gestation of pregnancy: Secondary | ICD-10-CM | POA: Insufficient documentation

## 2017-03-20 DIAGNOSIS — O289 Unspecified abnormal findings on antenatal screening of mother: Secondary | ICD-10-CM

## 2017-03-20 DIAGNOSIS — O24419 Gestational diabetes mellitus in pregnancy, unspecified control: Secondary | ICD-10-CM

## 2017-03-23 ENCOUNTER — Encounter: Payer: Self-pay | Admitting: Obstetrics & Gynecology

## 2017-03-23 DIAGNOSIS — O99019 Anemia complicating pregnancy, unspecified trimester: Secondary | ICD-10-CM | POA: Insufficient documentation

## 2017-03-25 ENCOUNTER — Telehealth: Payer: Self-pay | Admitting: General Practice

## 2017-03-25 NOTE — Telephone Encounter (Signed)
Called patient and informed her of low iron/hemoglobin levels and that she should go to her pharmacy to pick up an iron supplement. Patient verbalized understanding & had no questions

## 2017-04-14 ENCOUNTER — Ambulatory Visit (INDEPENDENT_AMBULATORY_CARE_PROVIDER_SITE_OTHER): Payer: Medicaid Other | Admitting: Family Medicine

## 2017-04-14 VITALS — BP 115/73 | HR 82 | Wt 222.9 lb

## 2017-04-14 DIAGNOSIS — O24419 Gestational diabetes mellitus in pregnancy, unspecified control: Secondary | ICD-10-CM | POA: Diagnosis not present

## 2017-04-14 DIAGNOSIS — Z641 Problems related to multiparity: Secondary | ICD-10-CM

## 2017-04-14 DIAGNOSIS — O0993 Supervision of high risk pregnancy, unspecified, third trimester: Secondary | ICD-10-CM

## 2017-04-14 DIAGNOSIS — O280 Abnormal hematological finding on antenatal screening of mother: Secondary | ICD-10-CM

## 2017-04-14 DIAGNOSIS — O99013 Anemia complicating pregnancy, third trimester: Secondary | ICD-10-CM

## 2017-04-14 DIAGNOSIS — R7611 Nonspecific reaction to tuberculin skin test without active tuberculosis: Secondary | ICD-10-CM | POA: Diagnosis not present

## 2017-04-14 LAB — POCT URINALYSIS DIP (DEVICE)
BILIRUBIN URINE: NEGATIVE
GLUCOSE, UA: 500 mg/dL — AB
HGB URINE DIPSTICK: NEGATIVE
KETONES UR: NEGATIVE mg/dL
Nitrite: NEGATIVE
Protein, ur: NEGATIVE mg/dL
SPECIFIC GRAVITY, URINE: 1.025 (ref 1.005–1.030)
Urobilinogen, UA: 1 mg/dL (ref 0.0–1.0)
pH: 6 (ref 5.0–8.0)

## 2017-04-14 NOTE — Progress Notes (Signed)
      Subjective:  Carly Cabrera is a 31 y.o. G5P4004 at 785w1d being seen today for ongoing prenatal care.  She is currently monitored for the following issues for this high-risk pregnancy and has History of gestational diabetes; Gestational diabetes mellitus (GDM) affecting pregnancy; Supervision of high-risk pregnancy; Positive TB test; Abnormal quad screen; Multiparity; and Anemia in pregnancy on her problem list.  Patient reports no complaints.  Contractions: Irregular. Vag. Bleeding: None.  Movement: Present. Denies leaking of fluid.   The following portions of the patient's history were reviewed and updated as appropriate: allergies, current medications, past family history, past medical history, past social history, past surgical history and problem list. Problem list updated.  Objective:   Vitals:   04/14/17 1148  BP: 115/73  Pulse: 82  Weight: 222 lb 14.4 oz (101.1 kg)    Fetal Status: Fetal Heart Rate (bpm): 135 Fundal Height: 31 cm Movement: Present      General:  Alert, oriented and cooperative. Patient is in no acute distress.  Skin: Skin is warm and dry. No rash noted.   Cardiovascular: Normal heart rate noted  Respiratory: Normal respiratory effort, no problems with respiration noted  Abdomen: Soft, gravid, appropriate for gestational age. Pain/Pressure: Absent     Pelvic:  Cervical exam deferred        Extremities: Normal range of motion.  Edema: None  Mental Status: Normal mood and affect. Normal behavior. Normal judgment and thought content.   Urinalysis: Urine Protein: Negative Urine Glucose: 3+      Assessment and Plan:  Pregnancy: G5P4004 at 685w1d  1. Supervision of high risk pregnancy in third trimester - GBS to be performed in 4 weeks - Labs reviewed, UTD. Failed 3 hr GTT: 111/250/-/-  2. Gestational diabetes mellitus (GDM) affecting pregnancy - GDMA2/B, on glyburide 5mg  qhs - See above for BS log today; dinnertime BS about 50% elevated, but  barely elevated. <50% FBS and breakfast/lunch are elevated. Will monitor and if still having a large number of elevated sugars throughout day, consider adding metformin to regimen.  3. Abnormal quad screen - NIPS normal  4. Multiparity  5. Anemia during pregnancy in third trimester - Iron daily   6. 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 Please refer to After Visit Summary for other counseling recommendations.  Return in about 2 weeks (around 04/28/2017) for Routine OB visit; In 1 week please begin twice weekly NSTs.   Jen MowElizabeth Mumaw, DO OB Fellow [facility]

## 2017-04-14 NOTE — Patient Instructions (Addendum)
Gestational Diabetes Mellitus, Diagnosis Gestational diabetes (gestational diabetes mellitus) is a short-term (temporary) form of diabetes that can happen during pregnancy. It goes away after you give birth. It may be caused by one or both of these problems:  Your body does not make enough of a hormone called insulin.  Your body does not respond in a normal way to insulin that it makes. Insulin lets sugars (glucose) go into cells in the body. This gives you energy. If you have diabetes, sugars cannot get into cells. This causes high blood sugar (hyperglycemia). If diabetes is treated, it may not hurt you or your baby. Your doctor will set treatment goals for you. In general, you should have these blood sugar levels:  After not eating for a long time (fasting): 95 mg/dL (5.3 mmol/L).  After meals (postprandial):  One hour after a meal: at or below 140 mg/dL (7.8 mmol/L).  Two hours after a meal: at or below 120 mg/dL (6.7 mmol/L).  A1c (hemoglobin A1c) level: 6-6.5%. Follow these instructions at home: Questions to Ask Your Doctor   You may want to ask these questions:  Do I need to meet with a diabetes educator?  Where can I find a support group for people with diabetes?  What equipment will I need to care for myself at home?  What diabetes medicines do I need? When should I take them?  How often do I need to check my blood sugar?  What number can I call if I have questions?  When is my next doctor's visit? General instructions   Take over-the-counter and prescription medicines only as told by your doctor.  Stay at a healthy weight during pregnancy.  Keep all follow-up visits as told by your doctor. This is important. Contact a doctor if:  Your blood sugar is at or above 240 mg/dL (16.113.3 mmol/L).  Your blood sugar is at or above 200 mg/dL (09.611.1 mmol/L) and you have ketones in your pee (urine).  You have been sick or have had a fever for 2 days or more and you are not  getting better.  You have any of these problems for more than 6 hours:  You cannot eat or drink.  You feel sick to your stomach (nauseous).  You throw up (vomit).  You have watery poop (diarrhea). Get help right away if:  Your blood sugar is lower than 54 mg/dL (3 mmol/L).  You get confused.  You have trouble:  Thinking clearly.  Breathing.  Your baby moves less than normal.  You have:  Moderate or large ketone levels in your pee (urine).  Bleeding from your vagina.  Unusual fluid coming from your vagina.  Early contractions. These may feel like tightness in your belly. This information is not intended to replace advice given to you by your health care provider. Make sure you discuss any questions you have with your health care provider. Document Released: 03/18/2016 Document Revised: 05/02/2016 Document Reviewed: 12/29/2015 Elsevier Interactive Patient Education  2017 Elsevier Inc.   SAFE MEDICATIONS IN PREGNANCY  Acne:  Benzoyl Peroxide  Salicylic Acid   Backache/Headache:  Tylenol: 2 regular strength every 4 hours OR        2 Extra strength every 6 hours   Colds/Coughs/Allergies:  Benadryl (alcohol free) 25 mg every 6 hours as needed  Breath right strips  Claritin  Cepacol throat lozenges  Chloraseptic throat spray  Cold-Eeze- up to three times per day  Cough drops, alcohol free  Flonase (by prescription only)  Guaifenesin  Mucinex  Robitussin DM (plain only, alcohol free)  Saline nasal spray/drops  Sudafed (pseudoephedrine) & Actifed * use only after [redacted] weeks gestation and if you do not have high blood pressure  Tylenol  Vicks Vaporub  Zinc lozenges  Zyrtec   Constipation:  Colace  Ducolax suppositories  Fleet enema  Glycerin suppositories  Metamucil  Milk of magnesia  Miralax  Senokot  Smooth move tea   Diarrhea:  Kaopectate  Imodium A-D   *NO pepto Bismol   Hemorrhoids:  Anusol  Anusol HC  Preparation H  Tucks    Indigestion:  Tums  Maalox  Mylanta  Zantac  Pepcid   Insomnia:  Benadryl (alcohol free) 25mg  every 6 hours as needed  Tylenol PM  Unisom, no Gelcaps   Leg Cramps:  Tums  MagGel   Nausea/Vomiting:  Bonine  Dramamine  Emetrol  Ginger extract  Sea bands  Meclizine  Nausea medication to take during pregnancy:  Unisom (doxylamine succinate 25 mg tablets) Take one tablet daily at bedtime. If symptoms are not adequately controlled, the dose can be increased to a maximum recommended dose of two tablets daily (1/2 tablet in the morning, 1/2 tablet mid-afternoon and one at bedtime).  Vitamin B6 100mg  tablets. Take one tablet twice a day (up to 200 mg per day).   Skin Rashes:  Aveeno products  Benadryl cream or 25mg  every 6 hours as needed  Calamine Lotion  1% cortisone cream   Yeast infection:  Gyne-lotrimin 7  Monistat 7    **If taking multiple medications, please check labels to avoid duplicating the same active ingredients  **take medication as directed on the label  ** Do not exceed 4000 mg of tylenol in 24 hours  **Do not take medications that contain aspirin or ibuprofen

## 2017-04-17 ENCOUNTER — Ambulatory Visit (HOSPITAL_COMMUNITY)
Admission: RE | Admit: 2017-04-17 | Discharge: 2017-04-17 | Disposition: A | Discharge status: Home / Self Care | Payer: Medicaid Other | Source: Ambulatory Visit | Attending: Obstetrics & Gynecology | Admitting: Obstetrics & Gynecology

## 2017-04-17 ENCOUNTER — Other Ambulatory Visit (HOSPITAL_COMMUNITY): Payer: Self-pay | Admitting: Maternal and Fetal Medicine

## 2017-04-17 ENCOUNTER — Encounter (HOSPITAL_COMMUNITY): Payer: Self-pay

## 2017-04-17 ENCOUNTER — Other Ambulatory Visit (HOSPITAL_COMMUNITY): Payer: Self-pay | Admitting: *Deleted

## 2017-04-17 DIAGNOSIS — O24415 Gestational diabetes mellitus in pregnancy, controlled by oral hypoglycemic drugs: Secondary | ICD-10-CM

## 2017-04-17 DIAGNOSIS — O289 Unspecified abnormal findings on antenatal screening of mother: Secondary | ICD-10-CM | POA: Diagnosis not present

## 2017-04-17 DIAGNOSIS — O24419 Gestational diabetes mellitus in pregnancy, unspecified control: Secondary | ICD-10-CM

## 2017-04-17 DIAGNOSIS — Z3A31 31 weeks gestation of pregnancy: Secondary | ICD-10-CM | POA: Insufficient documentation

## 2017-04-21 ENCOUNTER — Ambulatory Visit (INDEPENDENT_AMBULATORY_CARE_PROVIDER_SITE_OTHER): Payer: Medicaid Other | Admitting: Family Medicine

## 2017-04-21 DIAGNOSIS — O24419 Gestational diabetes mellitus in pregnancy, unspecified control: Secondary | ICD-10-CM | POA: Diagnosis present

## 2017-04-21 NOTE — Progress Notes (Signed)
NST:  Baseline: 130 bpm, Variability: Good {> 6 bpm), Accelerations: Reactive and Decelerations: Absent   

## 2017-04-24 ENCOUNTER — Ambulatory Visit: Payer: Self-pay

## 2017-04-24 ENCOUNTER — Ambulatory Visit (INDEPENDENT_AMBULATORY_CARE_PROVIDER_SITE_OTHER): Payer: Medicaid Other | Admitting: Obstetrics and Gynecology

## 2017-04-24 VITALS — BP 110/66 | HR 100

## 2017-04-24 DIAGNOSIS — O24419 Gestational diabetes mellitus in pregnancy, unspecified control: Secondary | ICD-10-CM

## 2017-04-24 DIAGNOSIS — O403XX0 Polyhydramnios, third trimester, not applicable or unspecified: Secondary | ICD-10-CM | POA: Diagnosis not present

## 2017-04-24 DIAGNOSIS — O409XX0 Polyhydramnios, unspecified trimester, not applicable or unspecified: Secondary | ICD-10-CM | POA: Insufficient documentation

## 2017-04-24 MED ORDER — TERCONAZOLE 0.4 % VA CREA: 1.0000 | TOPICAL_CREAM | Freq: Every day | VAGINAL | 0 refills | Status: DC

## 2017-04-24 NOTE — Progress Notes (Signed)
NST reviewed. FHT 125/mod var/reactive. Contractions every 5 minutes irregular.   Ernestina PennaNicholas Schenk, MD 04/24/17 11:29 AM

## 2017-04-24 NOTE — Progress Notes (Signed)
Pt reports vaginal itching, denies discharge. Terazol Rx sent to pharmacy per standing order.  Pt informed that the ultrasound is considered a limited OB ultrasound and is not intended to be a complete ultrasound exam.  Patient also informed that the ultrasound is not being completed with the intent of assessing for fetal or placental anomalies or any pelvic abnormalities.  Explained that the purpose of today's ultrasound is to assess for presentation and amniotic fluid volume.  Patient acknowledges the purpose of the exam and the limitations of the study.

## 2017-04-25 ENCOUNTER — Encounter (HOSPITAL_COMMUNITY): Payer: Self-pay

## 2017-04-25 ENCOUNTER — Ambulatory Visit (HOSPITAL_COMMUNITY): Payer: Medicaid Other

## 2017-04-29 ENCOUNTER — Ambulatory Visit (INDEPENDENT_AMBULATORY_CARE_PROVIDER_SITE_OTHER): Payer: Medicaid Other | Admitting: Obstetrics and Gynecology

## 2017-04-29 VITALS — BP 99/64 | HR 82 | Wt 223.3 lb

## 2017-04-29 DIAGNOSIS — O0993 Supervision of high risk pregnancy, unspecified, third trimester: Secondary | ICD-10-CM | POA: Diagnosis not present

## 2017-04-29 DIAGNOSIS — O403XX0 Polyhydramnios, third trimester, not applicable or unspecified: Secondary | ICD-10-CM

## 2017-04-29 DIAGNOSIS — O24419 Gestational diabetes mellitus in pregnancy, unspecified control: Secondary | ICD-10-CM | POA: Diagnosis not present

## 2017-04-29 MED ORDER — GLYBURIDE 2.5 MG PO TABS: 2.5000 mg | ORAL_TABLET | Freq: Two times a day (BID) | ORAL | 3 refills | Status: DC

## 2017-04-29 NOTE — Progress Notes (Signed)
   PRENATAL VISIT NOTE  Subjective:  Carly Cabrera is a 31 y.o. G5P4004 at 3927w2d being seen today for ongoing prenatal care.  She is currently monitored for the following issues for this high-risk pregnancy and has History of gestational diabetes; Gestational diabetes mellitus (GDM) affecting pregnancy; Supervision of high-risk pregnancy; Positive TB test; Abnormal quad screen; Multiparity; Anemia in pregnancy; and Polyhydramnios on her problem list.  Patient reports patient reports some monring she is feeling lightheaded and shakey when she wakes up sugars have been as low as 40's. about 15% of post prandial sugars minimally elevated. .  Contractions: Irregular. Vag. Bleeding: None.  Movement: Present. Denies leaking of fluid.   The following portions of the patient's history were reviewed and updated as appropriate: allergies, current medications, past family history, past medical history, past social history, past surgical history and problem list. Problem list updated.  Objective:   Vitals:   04/29/17 0939  BP: 99/64  Pulse: 82  Weight: 223 lb 4.8 oz (101.3 kg)    Fetal Status: Fetal Heart Rate (bpm): NST   Movement: Present      NST reactive baseline 130 no decels,no contractions  General:  Alert, oriented and cooperative. Patient is in no acute distress.  Skin: Skin is warm and dry. No rash noted.   Cardiovascular: Normal heart rate noted  Respiratory: Normal respiratory effort, no problems with respiration noted  Abdomen: Soft, gravid, appropriate for gestational age. Pain/Pressure: Present     Pelvic:  Cervical exam deferred        Extremities: Normal range of motion.  Edema: None  Mental Status: Normal mood and affect. Normal behavior. Normal judgment and thought content.   Assessment and Plan:  Pregnancy: G5P4004 at 7427w2d  1. Gestational diabetes mellitus (GDM) affecting pregnancy -split glyburide dosing to 2.5 in am and 2.5 in PM in an attempt to minimize fasting  hypoglycemia and help with postprandial elevations.  - Fetal nonstress test  2. Polyhydramnios in third trimester complication, single or unspecified fetus - repeat US ordered - Fetal nonstress test  3. Supervision of high risk pregnancy in third trimester  up to date on screening   Preterm labor symptoms and general obstetric precautions including but not limited to vaginal bleeding, contractions, leaking of fluid and fetal movement were reviewed in detail with the patient. Please refer to After Visit Summary for other counseling recommendations.  Return in about 7 days (around 05/06/2017) for NST only; 6/5  Ob fu and NST.   Ernestina PennaNicholas Schenk, MD

## 2017-04-29 NOTE — Progress Notes (Signed)
Pt states she is checking her blood sugar 3 times daily.  US for growth scheduled on 6/8.

## 2017-04-29 NOTE — Patient Instructions (Signed)
Diabetes Mellitus and Food It is important for you to manage your blood sugar (glucose) level. Your blood glucose level can be greatly affected by what you eat. Eating healthier foods in the appropriate amounts throughout the day at about the same time each day will help you control your blood glucose level. It can also help slow or prevent worsening of your diabetes mellitus. Healthy eating may even help you improve the level of your blood pressure and reach or maintain a healthy weight. General recommendations for healthful eating and cooking habits include:  Eating meals and snacks regularly. Avoid going long periods of time without eating to lose weight.  Eating a diet that consists mainly of plant-based foods, such as fruits, vegetables, nuts, legumes, and whole grains.  Using low-heat cooking methods, such as baking, instead of high-heat cooking methods, such as deep frying.  Work with your dietitian to make sure you understand how to use the Nutrition Facts information on food labels. How can food affect me? Carbohydrates Carbohydrates affect your blood glucose level more than any other type of food. Your dietitian will help you determine how many carbohydrates to eat at each meal and teach you how to count carbohydrates. Counting carbohydrates is important to keep your blood glucose at a healthy level, especially if you are using insulin or taking certain medicines for diabetes mellitus. Alcohol Alcohol can cause sudden decreases in blood glucose (hypoglycemia), especially if you use insulin or take certain medicines for diabetes mellitus. Hypoglycemia can be a life-threatening condition. Symptoms of hypoglycemia (sleepiness, dizziness, and disorientation) are similar to symptoms of having too much alcohol. If your health care provider has given you approval to drink alcohol, do so in moderation and use the following guidelines:  Women should not have more than one drink per day, and men  should not have more than two drinks per day. One drink is equal to: ? 12 oz of beer. ? 5 oz of wine. ? 1 oz of hard liquor.  Do not drink on an empty stomach.  Keep yourself hydrated. Have water, diet soda, or unsweetened iced tea.  Regular soda, juice, and other mixers might contain a lot of carbohydrates and should be counted.  What foods are not recommended? As you make food choices, it is important to remember that all foods are not the same. Some foods have fewer nutrients per serving than other foods, even though they might have the same number of calories or carbohydrates. It is difficult to get your body what it needs when you eat foods with fewer nutrients. Examples of foods that you should avoid that are high in calories and carbohydrates but low in nutrients include:  Trans fats (most processed foods list trans fats on the Nutrition Facts label).  Regular soda.  Juice.  Candy.  Sweets, such as cake, pie, doughnuts, and cookies.  Fried foods.  What foods can I eat? Eat nutrient-rich foods, which will nourish your body and keep you healthy. The food you should eat also will depend on several factors, including:  The calories you need.  The medicines you take.  Your weight.  Your blood glucose level.  Your blood pressure level.  Your cholesterol level.  You should eat a variety of foods, including:  Protein. ? Lean cuts of meat. ? Proteins low in saturated fats, such as fish, egg whites, and beans. Avoid processed meats.  Fruits and vegetables. ? Fruits and vegetables that may help control blood glucose levels, such as apples,   mangoes, and yams.  Dairy products. ? Choose fat-free or low-fat dairy products, such as milk, yogurt, and cheese.  Grains, bread, pasta, and rice. ? Choose whole grain products, such as multigrain bread, whole oats, and brown rice. These foods may help control blood pressure.  Fats. ? Foods containing healthful fats, such as  nuts, avocado, olive oil, canola oil, and fish.  Does everyone with diabetes mellitus have the same meal plan? Because every person with diabetes mellitus is different, there is not one meal plan that works for everyone. It is very important that you meet with a dietitian who will help you create a meal plan that is just right for you. This information is not intended to replace advice given to you by your health care provider. Make sure you discuss any questions you have with your health care provider. Document Released: 08/22/2005 Document Revised: 05/02/2016 Document Reviewed: 10/22/2013 Elsevier Interactive Patient Education  2017 [employer]  

## 2017-05-02 ENCOUNTER — Ambulatory Visit: Payer: Self-pay

## 2017-05-02 ENCOUNTER — Ambulatory Visit (INDEPENDENT_AMBULATORY_CARE_PROVIDER_SITE_OTHER): Payer: Medicaid Other | Admitting: Obstetrics and Gynecology

## 2017-05-02 VITALS — BP 112/70 | HR 92

## 2017-05-02 DIAGNOSIS — O403XX0 Polyhydramnios, third trimester, not applicable or unspecified: Secondary | ICD-10-CM

## 2017-05-02 DIAGNOSIS — O24419 Gestational diabetes mellitus in pregnancy, unspecified control: Secondary | ICD-10-CM | POA: Diagnosis not present

## 2017-05-02 NOTE — Progress Notes (Signed)
Reactive NST 

## 2017-05-06 ENCOUNTER — Ambulatory Visit (INDEPENDENT_AMBULATORY_CARE_PROVIDER_SITE_OTHER): Payer: Medicaid Other | Admitting: Family Medicine

## 2017-05-06 VITALS — BP 109/69 | HR 102 | Wt 224.0 lb

## 2017-05-06 DIAGNOSIS — O99013 Anemia complicating pregnancy, third trimester: Secondary | ICD-10-CM | POA: Diagnosis not present

## 2017-05-06 DIAGNOSIS — O24419 Gestational diabetes mellitus in pregnancy, unspecified control: Secondary | ICD-10-CM | POA: Diagnosis present

## 2017-05-06 DIAGNOSIS — O0993 Supervision of high risk pregnancy, unspecified, third trimester: Secondary | ICD-10-CM | POA: Diagnosis not present

## 2017-05-06 DIAGNOSIS — O320XX0 Maternal care for unstable lie, not applicable or unspecified: Secondary | ICD-10-CM

## 2017-05-06 DIAGNOSIS — O403XX0 Polyhydramnios, third trimester, not applicable or unspecified: Secondary | ICD-10-CM | POA: Diagnosis not present

## 2017-05-06 DIAGNOSIS — R7611 Nonspecific reaction to tuberculin skin test without active tuberculosis: Secondary | ICD-10-CM

## 2017-05-06 DIAGNOSIS — O280 Abnormal hematological finding on antenatal screening of mother: Secondary | ICD-10-CM | POA: Diagnosis not present

## 2017-05-06 LAB — POCT URINALYSIS DIP (DEVICE)
BILIRUBIN URINE: NEGATIVE
GLUCOSE, UA: 500 mg/dL — AB
Nitrite: NEGATIVE
PROTEIN: 30 mg/dL — AB
SPECIFIC GRAVITY, URINE: 1.025 (ref 1.005–1.030)
Urobilinogen, UA: 1 mg/dL (ref 0.0–1.0)
pH: 6 (ref 5.0–8.0)

## 2017-05-06 NOTE — Patient Instructions (Signed)
Gestational Diabetes Mellitus, Self Care When you have gestational diabetes (gestational diabetes mellitus), you must keep your blood sugar (glucose) under control. You can do this with:  Nutrition.  Exercise.  Lifestyle changes.  Medicines or insulin, if needed.  Support from your doctors and others. How do I manage my blood sugar?  Check your blood sugar every day during pregnancy. Check it as often as told.  Call your doctor if your blood sugar is above your goal numbers for 2 tests in a row. Your doctor will set treatment goals for you. Try to have these blood sugars:  After not eating for a long time (after fasting): at or below 95 mg/dL (5.3 mmol/L).  After meals (postprandial):  One hour after a meal: at or below 140 mg/dL (7.8 mmol/L).  Two hours after a meal: at or below 120 mg/dL (6.7 mmol/L).  A1c (hemoglobin A1c) level: 6-6.5%. What do I need to know about high blood sugar? High blood sugar is called hyperglycemia. Know the early signs of high blood sugar. Signs include:  Feeling:  Thirsty.  Hungry.  Very tired.  Needing to pee (urinate) more than usual.  Blurry vision. What do I need to know about low blood sugar? Low blood sugar is called hypoglycemia. This is when blood sugar is at or below 70 mg/dL (3.9 mmol/L). Symptoms may include:  Feeling:  Hungry.  Worried or nervous (anxious).  Sweaty or clammy.  Confused.  Dizzy.  Sleepy.  Sick to your stomach (nauseous).  Having:  A fast heartbeat.  A headache.  A change in vision.  Jerky movements that you cannot control (seizure).  Nightmares.  Tingling or no feeling (numbness) around the mouth, lips, or tongue.  Having trouble with:  Talking.  Paying attention (concentrating).  Moving (coordination).  Sleeping.  Shaking.  Passing out (fainting).  Getting upset easily (irritability). Treating low blood sugar   To treat low blood sugar, eat or drink something sugary  right away. If you can think clearly and swallow safely, follow the 15:15 rule:  Take 15 grams of a fast-acting carb (carbohydrate). Some fast-acting carbs are:  1 tube of glucose gel.  3 sugar tablets (glucose pills).  6-8 pieces of hard candy.  4 oz (120 mL) of fruit juice.  4 oz (120 mL) regular (not diet) soda.  Check your blood sugar 15 minutes after you take the carb.  If your blood sugar is still at or below 70 mg/dL (3.9 mmol/L), take 15 grams of a carb again.  If your blood sugar does not go above 70 mg/dL (3.9 mmol/L) after 3 tries, get help right away.  After your blood sugar goes back to normal, eat a meal or a snack within 1 hour. Treating very low blood sugar  If your blood sugar is at or below 54 mg/dL (3 mmol/L), you have very low blood sugar (severe hypoglycemia). This is an emergency. Do not wait to see if the symptoms will go away. Get medical help right away. Call your local emergency services (911 in the [place] Do not drive yourself to the hospital. If you have very low blood sugar and you cannot eat or drink, you may need a glucagon shot (injection). A family member or friend should learn:  How to check your blood sugar.  How to give you a glucagon shot. Ask your doctor if you need a glucagon shot kit at home. What else is important to manage my diabetes? Medicine   Take your insulin  and diabetes medicines as told.  Do not run out of insulin or medicines.  Adjust your insulin and diabetes medicines as told. Food    Make healthy food choices. These include:  Chicken, fish, egg whites, and beans.  Oats, whole wheat, bulgur, brown rice, quinoa, and millet.  Fresh fruits and vegetables.  Low-fat dairy products.  Nuts, avocado, olive oil, and canola oil.  Make an eating plan. A food specialist (dietitian) can help you.  Follow instructions from your doctor about what you cannot eat or drink.  Drink enough fluid to keep your pee (urine) clear or  pale yellow.  Eat healthy snacks between healthy meals.  Keep track of carbs you eat. Read food labels. Learn about food serving sizes.  Follow your sick day plan when you cannot eat or drink normally. Make this plan with your doctor. Activity   Exercise 30 minutes or more a day or as much as told by your doctor.  Talk with your doctor if you start a new exercise. Your doctor may need to adjust your insulin, medicines, or food. Lifestyle   Do not drink alcohol.  Do not use any tobacco products. If you need help quitting, ask your doctor.  Learn how to deal with stress. If you need help with this, ask your doctor. Body care    Stay up to date with your shots (immunizations).  Brush your teeth and gums two times a day. Floss at least one time a day.  Go to the dentist least one time every 6 months.  Stay at a healthy weight while you are pregnant. General instructions   Take over-the-counter and prescription medicines only as told by your doctor.  Ask your doctor about risks of high blood pressure in pregnancy. These are called preeclampsia and eclampsia.  Share your diabetes care plan with:  Your work or school.  People you live with.  Check your pee for ketones:  When you are sick.  As told by your doctor.  Ask your doctor:  Do I need to meet with a diabetes educator?  Where can I find a support group for people with diabetes?  Carry a card or wear jewelry that says that you have diabetes.  Keep all follow-up visits with your doctor. This is important. Care after giving birth    Have your blood sugar checked 4-12 weeks after you give birth.  Get checked for diabetes at least every 3 years. Where to find more information: To learn more about diabetes, visit:  American Diabetes Association: www.diabetes.org/diabetes-basics/gestational  Centers for Disease Control and Prevention (CDC): ShorterSale.fr.pdf This  information is not intended to replace advice given to you by your health care provider. Make sure you discuss any questions you have with your health care provider. Document Released: 03/18/2016 Document Revised: 05/02/2016 Document Reviewed: 12/29/2015 Elsevier Interactive Patient Education  2017 [employer].   Polyhydramnios When a woman becomes pregnant, a sac is formed around the fertilized egg (embryo) and later the growing baby (fetus). This sac is called the amniotic sac. The amniotic sac is filled with fluid. It gets bigger as the pregnancy grows. When there is too much fluid in the sac, it is called polyhydramnios. All babies born with polyhydramnios should be checked for congenital abnormalities. The amniotic fluid cushions and protects the baby. It also provides the baby with fluids and is crucial to normal development. Your baby breathes this fluid into its lungs and swallows it. This helps promote the healthy growth of  the lungs and gastrointestinal tract. Amniotic fluid also helps the baby move around, helping with the normal development of muscle and bone. What are the causes? This condition is caused by:  Diabetes mellitus.  Down's syndrome, fetal abnormalities of the intestinal tract, and anencephaly (fetus without brain), all of which can prevent the fetus from swallowing the amniotic fluid.  One twins passes (transfuses) its blood into the other twin (twin-twin transfusion syndrome).  Medical illness of the mother, e.g., kidney or heart disease.  Tumor (chorioangioma) of the placenta. What are the signs or symptoms? Symptoms of this condition include:  Enlarged uterus. The size of the uterus enlarges beyond what it should be for that particular time of the pregnancy.  Pressure and discomfort. The mother may feel more pressure and discomfort than should be expected.  Quick and unexpected enlargement of the mother's stomach. How is this diagnosed? This condition is  diagnosed when your health care provider measures you and notices that your uterus is beyond the size that is consistent with the time of the pregnancy.  An ultrasound is then used (abdominally or vaginally) to:  See if you are carrying twins or more.  Measure the growth of the baby.  Look for birth defects.  Measure the amount of fluid in the amniotic sac.  Amniotic Fluid Index (AFI) measures the amount of fluid in the amniotic sac in four different areas. If there is more than 24 centimeters, you have polyhydramnios. How is this treated? This condition may be treated by:  Removing some fluid from the amniotic sac.  Taking medications that lower the fluids in your body.  Stopping the use of salt or salty foods because it causes you to keep fluid in your body (retention). Follow these instructions at home:  Keep all your prenatal visits as told by your health care provider. It is important to follow your health care provider's recommendations.  Do not eat a lot of salt and salty foods.  If you have diabetes, keep it under control.  If you have heart or kidney disease, treat the disease as told by your health care provider. Contact a health care provider if:  You think your uterus has grown too fast in a short period of time.  You feel a great amount of pressure in your lower belly (pelvis) and are more uncomfortable than expected. Get help right away if:  You have a gush of fluid or are leaking fluid from your vagina.  You stop feeling the baby move.  You do not feel the baby kicking as much as usual.  You have a hard time keeping your diabetes under control.  You are having problems with your heart or kidney disease. This information is not intended to replace advice given to you by your health care provider. Make sure you discuss any questions you have with your health care provider. Document Released: 02/15/2003 Document Revised: 05/02/2016 Document Reviewed:  06/24/2013 Elsevier Interactive Patient Education  2017 [employer].

## 2017-05-06 NOTE — Progress Notes (Signed)
Pt completed 7 Cadience Bradfield course of Terazol- still reports vaginal itching - denies discharge. US for growth scheduled on 6/8

## 2017-05-06 NOTE — Progress Notes (Signed)
      Subjective:  Carly Cabrera is a 31 y.o. G5P4004 at 8420w2d being seen today for ongoing prenatal care.  She is currently monitored for the following issues for this high-risk pregnancy and has History of gestational diabetes; Gestational diabetes mellitus (GDM) affecting pregnancy; Supervision of high-risk pregnancy; Positive TB test; Abnormal quad screen; Multiparity; Anemia in pregnancy; and Polyhydramnios on her problem list.  Patient reports no complaints.  Contractions: Irregular. Vag. Bleeding: None.  Movement: Present. Denies leaking of fluid.   The following portions of the patient's history were reviewed and updated as appropriate: allergies, current medications, past family history, past medical history, past social history, past surgical history and problem list. Problem list updated.  Objective:   Vitals:   05/06/17 1013  BP: 109/69  Pulse: (!) 102  Weight: 224 lb (101.6 kg)    Fetal Status: Fetal Heart Rate (bpm): NST Fundal Height: 34 cm Movement: Present      General:  Alert, oriented and cooperative. Patient is in no acute distress.  Skin: Skin is warm and dry. No rash noted.   Cardiovascular: Normal heart rate noted  Respiratory: Normal respiratory effort, no problems with respiration noted  Abdomen: Soft, gravid, appropriate for gestational age. Pain/Pressure: Present     Pelvic:  Cervical exam deferred        Extremities: Normal range of motion.  Edema: None  Mental Status: Normal mood and affect. Normal behavior. Normal judgment and thought content.   Urinalysis: Urine Protein: 1+ Urine Glucose: 3+  I personally reviewed the patient's NST today, found to be REACTIVE. 130 bpm, mod var, +accels, no decels. CTX: None.      Assessment and Plan:  Pregnancy: G5P4004 at 120w2d  1. Supervision of high risk pregnancy in third trimester - [facility], will monitor closely, mentioned to patient if remains in breech or transverse lie by  37 weeks, can perform ECV, patient in agreement  2. Gestational diabetes mellitus (GDM) affecting pregnancy - Improved BS logs, however majority of FBS are < 60, concern for morning hypoglycemia, will STOP nighttime 2.5mg  glyburide, will have patient only take AM 2.5mg  glyburide, PP sugars are normal. She states improvement with shakiness/jitteriness in AM. - Fetal nonstress test : REACTIVE  3. Polyhydramnios in third trimester complication, single or unspecified fetus - Fetal nonstress test - last AFI 22cm, normal (high normal), will repeat again and if remains normal, likely resolved  4. Unstable fetal lie, single or unspecified fetus - FRANK BREECH last US, to monitor and at 37 weeks, if still malpresentation, will perform ECV  5. Anemia during pregnancy in third trimester  6. Abnormal quad screen - NIPS nml  7. Positive TB test - CXR was neg, follow up with [facility] after pregnancy   MOC: Nexplanon MOF: Breastfeeding Preterm labor symptoms and general obstetric precautions including but not limited to vaginal bleeding, contractions, leaking of fluid and fetal movement were reviewed in detail with the patient. Please refer to After Visit Summary for other counseling recommendations.  Return in about 7 days (around 05/13/2017) for Ob fu and NST.   Jen MowElizabeth Omeka Holben, DO OB Fellow Center for [facility], [facility]

## 2017-05-07 ENCOUNTER — Other Ambulatory Visit: Payer: Medicaid Other | Admitting: Advanced Practice Midwife

## 2017-05-09 ENCOUNTER — Ambulatory Visit (INDEPENDENT_AMBULATORY_CARE_PROVIDER_SITE_OTHER): Payer: Medicaid Other | Admitting: Family Medicine

## 2017-05-09 ENCOUNTER — Ambulatory Visit: Payer: Self-pay

## 2017-05-09 VITALS — BP 99/66 | HR 88

## 2017-05-09 DIAGNOSIS — O24419 Gestational diabetes mellitus in pregnancy, unspecified control: Secondary | ICD-10-CM

## 2017-05-09 DIAGNOSIS — O403XX0 Polyhydramnios, third trimester, not applicable or unspecified: Secondary | ICD-10-CM

## 2017-05-09 NOTE — Progress Notes (Signed)
US for growth scheduled 6/8, BPP added

## 2017-05-09 NOTE — Progress Notes (Signed)
NST reactive.

## 2017-05-12 ENCOUNTER — Other Ambulatory Visit: Payer: Medicaid Other | Admitting: Family Medicine

## 2017-05-14 ENCOUNTER — Ambulatory Visit (INDEPENDENT_AMBULATORY_CARE_PROVIDER_SITE_OTHER): Payer: Medicaid Other | Admitting: Family Medicine

## 2017-05-14 ENCOUNTER — Other Ambulatory Visit (HOSPITAL_COMMUNITY)
Admission: RE | Admit: 2017-05-14 | Discharge: 2017-05-14 | Disposition: A | Discharge status: Home / Self Care | Payer: Medicaid Other | Source: Ambulatory Visit | Attending: Family Medicine | Admitting: Family Medicine

## 2017-05-14 ENCOUNTER — Other Ambulatory Visit: Payer: Medicaid Other | Admitting: Advanced Practice Midwife

## 2017-05-14 VITALS — BP 108/64 | HR 92 | Wt 222.4 lb

## 2017-05-14 DIAGNOSIS — B373 Candidiasis of vulva and vagina: Secondary | ICD-10-CM | POA: Diagnosis not present

## 2017-05-14 DIAGNOSIS — N898 Other specified noninflammatory disorders of vagina: Secondary | ICD-10-CM | POA: Diagnosis not present

## 2017-05-14 DIAGNOSIS — Z113 Encounter for screening for infections with a predominantly sexual mode of transmission: Secondary | ICD-10-CM | POA: Diagnosis not present

## 2017-05-14 DIAGNOSIS — O26893 Other specified pregnancy related conditions, third trimester: Secondary | ICD-10-CM

## 2017-05-14 DIAGNOSIS — L298 Other pruritus: Secondary | ICD-10-CM | POA: Diagnosis not present

## 2017-05-14 DIAGNOSIS — O403XX0 Polyhydramnios, third trimester, not applicable or unspecified: Secondary | ICD-10-CM

## 2017-05-14 DIAGNOSIS — O24419 Gestational diabetes mellitus in pregnancy, unspecified control: Secondary | ICD-10-CM

## 2017-05-14 DIAGNOSIS — O23593 Infection of other part of genital tract in pregnancy, third trimester: Secondary | ICD-10-CM | POA: Diagnosis not present

## 2017-05-14 DIAGNOSIS — Z3A35 35 weeks gestation of pregnancy: Secondary | ICD-10-CM | POA: Insufficient documentation

## 2017-05-14 DIAGNOSIS — B9689 Other specified bacterial agents as the cause of diseases classified elsewhere: Secondary | ICD-10-CM | POA: Insufficient documentation

## 2017-05-14 DIAGNOSIS — O280 Abnormal hematological finding on antenatal screening of mother: Secondary | ICD-10-CM | POA: Diagnosis not present

## 2017-05-14 DIAGNOSIS — O0993 Supervision of high risk pregnancy, unspecified, third trimester: Secondary | ICD-10-CM

## 2017-05-14 DIAGNOSIS — B3731 Acute candidiasis of vulva and vagina: Secondary | ICD-10-CM

## 2017-05-14 LAB — OB RESULTS CONSOLE GBS: STREP GROUP B AG: POSITIVE

## 2017-05-14 NOTE — Progress Notes (Signed)
Pt has US for growth and BPP on 6/8. She reports she is still having vaginal itching despite completion of Terazol.

## 2017-05-14 NOTE — Progress Notes (Signed)
      Subjective:  Carly Cabrera is a 31 y.o. G5P4004 at 7919w3d being seen today for ongoing prenatal care.  She is currently monitored for the following issues for this high-risk pregnancy and has History of gestational diabetes; Gestational diabetes mellitus (GDM) affecting pregnancy; Supervision of high-risk pregnancy; Positive TB test; Abnormal quad screen; Multiparity; Anemia in pregnancy; and Polyhydramnios on her problem list.  Patient reports vaginal itching still remains after Terazol cream. No abnormal vag discharge. .  Contractions: Irregular. Vag. Bleeding: None.  Movement: Present. Denies leaking of fluid.   The following portions of the patient's history were reviewed and updated as appropriate: allergies, current medications, past family history, past medical history, past social history, past surgical history and problem list. Problem list updated.  Objective:   Vitals:   05/14/17 0959  BP: 108/64  Pulse: 92  Weight: 222 lb 6.4 oz (100.9 kg)    Fetal Status: Fetal Heart Rate (bpm): NST Fundal Height: 36 cm Movement: Present      General:  Alert, oriented and cooperative. Patient is in no acute distress.  Skin: Skin is warm and dry. No rash noted.   Cardiovascular: Normal heart rate noted  Respiratory: Normal respiratory effort, no problems with respiration noted  Abdomen: Soft, gravid, appropriate for gestational age. Pain/Pressure: Present     Pelvic:  Cervical exam deferred        Extremities: Normal range of motion.     Mental Status: Normal mood and affect. Normal behavior. Normal judgment and thought content.   Urinalysis:       I personally reviewed the patient's NST today, found to be REACTIVE. 135 bpm, mod var, +accels, no decels. CTX: NONE.       Assessment and Plan:  Pregnancy: G5P4004 at 5219w3d  1. Supervision of high risk pregnancy in third trimester - GBS today - Wet Prep today - GC/CT today  2. Gestational diabetes mellitus (GDM) affecting  pregnancy - Stopping medication due to low BS and only a two elevated BS at lunch and one at dinner, majority of BS are normal or low. Hypoglycemia (<60) in the AM (has been taking 2.5mg  glyburide qAM only now, stopped QHS last week). Will continue testing for the duration of the week and will reexamine her BS next visit, if remains normal, ultrasound is normal, and fluid levels are normal, can resume per GDMA1 guidelines for testing (with IOL at 40 weeks).  - Fetal nonstress test : REACTIVE  3. Polyhydramnios in third trimester complication, single or unspecified fetus - Resolved, AFI 19cm last US - Fetal nonstress test  4. Abnormal quad screen - NIPS Neg   MOC: Nexplanon MOF: Breastfeed Preterm labor symptoms and general obstetric precautions including but not limited to vaginal bleeding, contractions, leaking of fluid and fetal movement were reviewed in detail with the patient. Please refer to After Visit Summary for other counseling recommendations.  Return in about 8 days (around 05/22/2017) for 6/14 or 6/15 for Ob fu .   Jen MowElizabeth Mumaw, DO OB Fellow Center for [facility], [facility]

## 2017-05-15 LAB — CERVICOVAGINAL ANCILLARY ONLY
Bacterial vaginitis: NEGATIVE
CHLAMYDIA, DNA PROBE: NEGATIVE
Candida vaginitis: POSITIVE — AB
NEISSERIA GONORRHEA: NEGATIVE
Trichomonas: NEGATIVE

## 2017-05-15 NOTE — Patient Instructions (Signed)
Gestational Diabetes Mellitus, Self Care When you have gestational diabetes (gestational diabetes mellitus), you must keep your blood sugar (glucose) under control. You can do this with:  Nutrition.  Exercise.  Lifestyle changes.  Medicines or insulin, if needed.  Support from your doctors and others.  How do I manage my blood sugar?  Check your blood sugar every day during pregnancy. Check it as often as told.  Call your doctor if your blood sugar is above your goal numbers for 2 tests in a row. Your doctor will set treatment goals for you. Try to have these blood sugars:  After not eating for a long time (after fasting): at or below 95 mg/dL (5.3 mmol/L).  After meals (postprandial): ? One hour after a meal: at or below 140 mg/dL (7.8 mmol/L). ? Two hours after a meal: at or below 120 mg/dL (6.7 mmol/L).  A1c (hemoglobin A1c) level: 6-6.5%.  What do I need to know about high blood sugar? High blood sugar is called hyperglycemia. Know the early signs of high blood sugar. Signs include:  Feeling: ? Thirsty. ? Hungry. ? Very tired.  Needing to pee (urinate) more than usual.  Blurry vision.  What do I need to know about low blood sugar? Low blood sugar is called hypoglycemia. This is when blood sugar is at or below 70 mg/dL (3.9 mmol/L). Symptoms may include:  Feeling: ? Hungry. ? Worried or nervous (anxious). ? Sweaty or clammy. ? Confused. ? Dizzy. ? Sleepy. ? Sick to your stomach (nauseous).  Having: ? A fast heartbeat. ? A headache. ? A change in vision. ? Jerky movements that you cannot control (seizure). ? Nightmares. ? Tingling or no feeling (numbness) around the mouth, lips, or tongue.  Having trouble with: ? Talking. ? Paying attention (concentrating). ? Moving (coordination). ? Sleeping.  Shaking.  Passing out (fainting).  Getting upset easily (irritability).  Treating low blood sugar  To treat low blood sugar, eat or drink something  sugary right away. If you can think clearly and swallow safely, follow the 15:15 rule:  Take 15 grams of a fast-acting carb (carbohydrate). Some fast-acting carbs are: ? 1 tube of glucose gel. ? 3 sugar tablets (glucose pills). ? 6-8 pieces of hard candy. ? 4 oz (120 mL) of fruit juice. ? 4 oz (120 mL) regular (not diet) soda.  Check your blood sugar 15 minutes after you take the carb.  If your blood sugar is still at or below 70 mg/dL (3.9 mmol/L), take 15 grams of a carb again.  If your blood sugar does not go above 70 mg/dL (3.9 mmol/L) after 3 tries, get help right away.  After your blood sugar goes back to normal, eat a meal or a snack within 1 hour.  Treating very low blood sugar If your blood sugar is at or below 54 mg/dL (3 mmol/L), you have very low blood sugar (severe hypoglycemia). This is an emergency. Do not wait to see if the symptoms will go away. Get medical help right away. Call your local emergency services (911 in the [place] Do not drive yourself to the hospital. If you have very low blood sugar and you cannot eat or drink, you may need a glucagon shot (injection). A family member or friend should learn:  How to check your blood sugar.  How to give you a glucagon shot.  Ask your doctor if you need a glucagon shot kit at home. What else is important to manage my diabetes? Medicine  Take your insulin and diabetes medicines as told.  Do not run out of insulin or medicines.  Adjust your insulin and diabetes medicines as told. Food   Make healthy food choices. These include: ? Chicken, fish, egg whites, and beans. ? Oats, whole wheat, bulgur, brown rice, quinoa, and millet. ? Fresh fruits and vegetables. ? Low-fat dairy products. ? Nuts, avocado, olive oil, and canola oil.  Make an eating plan. A food specialist (dietitian) can help you.  Follow instructions from your doctor about what you cannot eat or drink.  Drink enough fluid to keep your pee (urine)  clear or pale yellow.  Eat healthy snacks between healthy meals.  Keep track of carbs you eat. Read food labels. Learn about food serving sizes.  Follow your sick day plan when you cannot eat or drink normally. Make this plan with your doctor. Activity  Exercise 30 minutes or more a day or as much as told by your doctor.  Talk with your doctor if you start a new exercise. Your doctor may need to adjust your insulin, medicines, or food. Lifestyle  Do not drink alcohol.  Do not use any tobacco products. If you need help quitting, ask your doctor.  Learn how to deal with stress. If you need help with this, ask your doctor. Body care   Stay up to date with your shots (immunizations).  Brush your teeth and gums two times a day. Floss at least one time a day.  Go to the dentist least one time every 6 months.  Stay at a healthy weight while you are pregnant. General instructions  Take over-the-counter and prescription medicines only as told by your doctor.  Ask your doctor about risks of high blood pressure in pregnancy. These are called preeclampsia and eclampsia.  Share your diabetes care plan with: ? Your work or school. ? People you live with.  Check your pee for ketones: ? When you are sick. ? As told by your doctor.  Ask your doctor: ? Do I need to meet with a diabetes educator? ? Where can I find a support group for people with diabetes?  Carry a card or wear jewelry that says that you have diabetes.  Keep all follow-up visits with your doctor. This is important. Care after giving birth   Have your blood sugar checked 4-12 weeks after you give birth.  Get checked for diabetes at least every 3 years. Where to find more information: To learn more about diabetes, visit:  American Diabetes Association: www.diabetes.org/diabetes-basics/gestational  Centers for Disease Control and Prevention (CDC): http://sanchez-watson.com/.pdf  This  information is not intended to replace advice given to you by your health care provider. Make sure you discuss any questions you have with your health care provider. Document Released: 03/18/2016 Document Revised: 05/02/2016 Document Reviewed: 12/29/2015 Elsevier Interactive Patient Education  [employer].

## 2017-05-16 ENCOUNTER — Ambulatory Visit (HOSPITAL_COMMUNITY)
Admission: RE | Admit: 2017-05-16 | Discharge: 2017-05-16 | Disposition: A | Discharge status: Home / Self Care | Payer: Medicaid Other | Source: Ambulatory Visit | Attending: Obstetrics & Gynecology | Admitting: Obstetrics & Gynecology

## 2017-05-16 ENCOUNTER — Encounter (HOSPITAL_COMMUNITY): Payer: Self-pay

## 2017-05-16 DIAGNOSIS — O24419 Gestational diabetes mellitus in pregnancy, unspecified control: Secondary | ICD-10-CM | POA: Diagnosis present

## 2017-05-16 DIAGNOSIS — O289 Unspecified abnormal findings on antenatal screening of mother: Secondary | ICD-10-CM | POA: Diagnosis not present

## 2017-05-16 DIAGNOSIS — O24415 Gestational diabetes mellitus in pregnancy, controlled by oral hypoglycemic drugs: Secondary | ICD-10-CM

## 2017-05-16 DIAGNOSIS — Z3A35 35 weeks gestation of pregnancy: Secondary | ICD-10-CM | POA: Insufficient documentation

## 2017-05-16 DIAGNOSIS — O403XX0 Polyhydramnios, third trimester, not applicable or unspecified: Secondary | ICD-10-CM

## 2017-05-17 LAB — CULTURE, BETA STREP (GROUP B ONLY): Strep Gp B Culture: POSITIVE — AB

## 2017-05-18 MED ORDER — FLUCONAZOLE 150 MG PO TABS: 150.0000 mg | ORAL_TABLET | Freq: Once | ORAL | 1 refills | Status: AC

## 2017-05-18 NOTE — Addendum Note (Signed)
Addended by: Cleda ClarksMUMAW, Brevon Dewald W on: 05/18/2017 10:28 PM   Modules accepted: Orders

## 2017-05-22 ENCOUNTER — Ambulatory Visit (INDEPENDENT_AMBULATORY_CARE_PROVIDER_SITE_OTHER): Payer: Medicaid Other | Admitting: Obstetrics & Gynecology

## 2017-05-22 VITALS — BP 118/73 | HR 132 | Wt 222.3 lb

## 2017-05-22 DIAGNOSIS — O0993 Supervision of high risk pregnancy, unspecified, third trimester: Secondary | ICD-10-CM | POA: Diagnosis not present

## 2017-05-22 DIAGNOSIS — O99820 Streptococcus B carrier state complicating pregnancy: Secondary | ICD-10-CM | POA: Diagnosis not present

## 2017-05-22 DIAGNOSIS — O24419 Gestational diabetes mellitus in pregnancy, unspecified control: Secondary | ICD-10-CM | POA: Diagnosis present

## 2017-05-22 NOTE — Progress Notes (Signed)
Pt reports having strong UC's since yesterday. Pt stopped taking Glyburide 1 week ago as directed.

## 2017-05-22 NOTE — Patient Instructions (Signed)
Return to clinic for any scheduled appointments or obstetric concerns, or go to [facility] for evaluation  

## 2017-05-22 NOTE — Progress Notes (Signed)
PRENATAL VISIT NOTE  Subjective:  Carly Cabrera is a 31 y.o. G5P4004 at [redacted]w[redacted]d being seen today for ongoing prenatal care.  She is currently monitored for the following issues for this high-risk pregnancy and has History of gestational diabetes; Gestational diabetes mellitus (GDM) affecting pregnancy; Supervision of high-risk pregnancy; Positive TB test; Abnormal quad screen; Multiparity; Anemia in pregnancy; and Group B Streptococcus carrier, +RV culture, currently pregnant on her problem list.  Patient reports occasional contractions.  Contractions: Irregular. Vag. Bleeding: None.  Movement: Present. Denies leaking of fluid.   The following portions of the patient's history were reviewed and updated as appropriate: allergies, current medications, past family history, past medical history, past social history, past surgical history and problem list. Problem list updated.  Objective:   Vitals:   05/22/17 0902  BP: 118/73  Pulse: (!) 132  Weight: 222 lb 4.8 oz (100.8 kg)    Fetal Status: Fetal Heart Rate (bpm): NST Fundal Height: 38 cm Movement: Present     General:  Alert, oriented and cooperative. Patient is in no acute distress.  Skin: Skin is warm and dry. No rash noted.   Cardiovascular: Normal heart rate noted  Respiratory: Normal respiratory effort, no problems with respiration noted  Abdomen: Soft, gravid, appropriate for gestational age. Pain/Pressure: Present     Pelvic:  Cervical exam deferred        Extremities: Normal range of motion.     Mental Status: Normal mood and affect. Normal behavior. Normal judgment and thought content.  [place] Mfm Fetal Bpp Wo Non Stress  Result Date: 05/16/2017 ----------------------------------------------------------------------  OBSTETRICS REPORT                      (Signed Final 05/16/2017 10:36 am) ---------------------------------------------------------------------- Patient Info  ID #:       [id number]                         D.O.B.:    Mar 26, 1986 (31 yrs)  Name:       Carly Cabrera                Visit Date:  05/16/2017 09:49 am ---------------------------------------------------------------------- Performed By  Performed By:     Percell Boston          Ref. Address:     [facility]                    RD[facility]                                                             [facility]                                                             [address]                                                             [place], [place]                                                             [address]  Attending:        Darlyn Read MD         Location:         [facility]  Referred By:      [facility] for                    [facility]                    Healthcare ---------------------------------------------------------------------- Orders   #  Description                                 Code   1  [place] MFM OB FOLLOW UP                         60454.09   2  [place] MFM FETAL BPP WO NON STRESS              81191.47  ----------------------------------------------------------------------   #  Ordered By               Order #        Accession #    Episode #   1  Darlyn Read              [id number]      [id number]     [id number]   2  JACOB STINSON            [id number]      [id number]     [id number]  ---------------------------------------------------------------------- Indications   [redacted] weeks gestation of pregnancy                Z3A.35   Abnormal biochemical screen (quad) for         O28.9   Trisomy 21   Gestational diabetes in pregnancy,             O24.415   controlled by oral hypoglycemic drugs  ---------------------------------------------------------------------- OB History  Gravidity:    5         Term:   4  Living:       4 ---------------------------------------------------------------------- Fetal Evaluation  Num Of Fetuses:     1  Fetal Heart         146   Rate(bpm):  Cardiac Activity:   Observed  Presentation:       Cephalic  Placenta:           Posterior, above cervical os  P. Cord Insertion:  Visualized, central  Amniotic Fluid  AFI FV:  Subjectively within normal limits  AFI Sum(cm)     %Tile       Largest Pocket(cm)  20.79           78          6.33  RUQ(cm)       RLQ(cm)       LUQ(cm)        LLQ(cm)  4.39          6.21          6.33           3.86 ---------------------------------------------------------------------- Biophysical Evaluation  Amniotic F.V:   Within normal limits       F. Tone:        Observed  F. Movement:    Observed                   Score:          8/8  F. Breathing:   Observed ---------------------------------------------------------------------- Biometry  BPD:      88.5  mm     G. Age:  35w 6d         59  %    CI:        80.48   %   70 - 86                                                          FL/HC:      22.2   %   20.1 - 22.1  HC:      311.6  mm     G. Age:  34w 6d          8  %    HC/AC:      1.04       0.93 - 1.11  AC:      300.6  mm     G. Age:  34w 0d         16  %    FL/BPD:     78.3   %   71 - 87  FL:       69.3  mm     G. Age:  35w 4d         41  %    FL/AC:      23.1   %   20 - 24  HUM:      61.7  mm     G. Age:  35w 5d         64  %  Est. FW:    2498  gm      5 lb 8 oz     40  % ---------------------------------------------------------------------- Gestational Age  LMP:           35w 5d       Date:   09/08/16                 EDD:   06/15/17  U/S Today:     35w 1d                                        EDD:   06/19/17  Best:  35w 5d    Det. By:   LMP  (09/08/16)          EDD:   06/15/17 ---------------------------------------------------------------------- Anatomy  Cranium:               Appears normal         Aortic Arch:            Previously seen  Cavum:                 Previously seen        Ductal Arch:            Previously seen  Ventricles:            Appears normal         Diaphragm:              Previously  seen  Choroid Plexus:        Previously seen        Stomach:                Appears normal, left                                                                        sided  Cerebellum:            Previously seen        Abdomen:                Previously seen  Posterior Fossa:       Previously seen        Abdominal Wall:         Previously seen  Nuchal Fold:           Not applicable (>20    Cord Vessels:           Previously seen                         wks GA)  Face:                  Orbits and profile     Kidneys:                Appear normal                         previously seen  Lips:                  Previously seen        Bladder:                Appears normal  Thoracic:              Appears normal         Spine:                  Previously seen  Heart:                 Appears normal         Upper Extremities:      Previously seen                         (  4CH, axis, and situs  RVOT:                  Appears normal         Lower Extremities:      Previously seen  LVOT:                  Appears normal  Other:  Fetus appears to be a female. Heels and 5th digit previously visualized.          Technically difficult due to fetal position. Nasal bone visualized. ---------------------------------------------------------------------- Cervix Uterus Adnexa  Cervix  Not visualized (advanced GA >29wks)  Uterus  No abnormality visualized.  Left Ovary  No adnexal mass visualized.  Right Ovary  No adnexal mass visualized.  Cul De Sac:   No free fluid seen.  Adnexa:       No abnormality visualized. ---------------------------------------------------------------------- Impression  Single living intrauterine pregnancy at [redacted]w[redacted]d.  Cephalic presentation.  Posterior placenta.  Appropriate interval fetal growth (40%).  Normal amniotic fluid volume.  Normal interval fetal anatomy.  BPP 8/8. ---------------------------------------------------------------------- Recommendations  Continue antenatal testing.  ----------------------------------------------------------------------                   Darlyn Read, MD Electronically Signed Final Report   05/16/2017 10:36 am ----------------------------------------------------------------------  [place] Mfm Ob Follow Up  Result Date: 05/16/2017 ----------------------------------------------------------------------  OBSTETRICS REPORT                      (Signed Final 05/16/2017 10:36 am) ---------------------------------------------------------------------- Patient Info  ID #:       [id number]                         D.O.B.:   Apr 21, 1986 (31 yrs)  Name:       Carly Cabrera                Visit Date:  05/16/2017 09:49 am ---------------------------------------------------------------------- Performed By  Performed By:     Percell Boston          Ref. Address:     [facility]                    RD[facility]                                                             [facility] Clinic                                                             [address]                                                             [address]d  [place], [place]                                                             [address]  Attending:        Darlyn Read MD         Location:         [facility]  Referred By:      [facility] for                    [facility]                    Healthcare ---------------------------------------------------------------------- Orders   #  Description                                 Code   1  [place] MFM OB FOLLOW UP                         60454.09   2  [place] MFM FETAL BPP WO NON STRESS              81191.47  ----------------------------------------------------------------------   #  Ordered By               Order #        Accession #    Episode #   1  Darlyn Read              [id number]      [id number]     [id number]   2  JACOB STINSON            [id number]      [id number]     [id number]   ---------------------------------------------------------------------- Indications   [redacted] weeks gestation of pregnancy                Z3A.35   Abnormal biochemical screen (quad) for         O28.9   Trisomy 21   Gestational diabetes in pregnancy,             O24.415   controlled by oral hypoglycemic drugs  ---------------------------------------------------------------------- OB History  Gravidity:    5         Term:   4  Living:       4 ---------------------------------------------------------------------- Fetal Evaluation  Num Of Fetuses:     1  Fetal Heart         146  Rate(bpm):  Cardiac Activity:   Observed  Presentation:       Cephalic  Placenta:           Posterior, above cervical os  P. Cord Insertion:  Visualized, central  Amniotic Fluid  AFI FV:      Subjectively within normal limits  AFI Sum(cm)     %Tile       Largest Pocket(cm)  20.79           78          6.33  RUQ(cm)       RLQ(cm)       LUQ(cm)  LLQ(cm)  4.39          6.21          6.33           3.86 ---------------------------------------------------------------------- Biophysical Evaluation  Amniotic F.V:   Within normal limits       F. Tone:        Observed  F. Movement:    Observed                   Score:          8/8  F. Breathing:   Observed ---------------------------------------------------------------------- Biometry  BPD:      88.5  mm     G. Age:  35w 6d         59  %    CI:        80.48   %   70 - 86                                                          FL/HC:      22.2   %   20.1 - 22.1  HC:      311.6  mm     G. Age:  34w 6d          8  %    HC/AC:      1.04       0.93 - 1.11  AC:      300.6  mm     G. Age:  34w 0d         16  %    FL/BPD:     78.3   %   71 - 87  FL:       69.3  mm     G. Age:  35w 4d         41  %    FL/AC:      23.1   %   20 - 24  HUM:      61.7  mm     G. Age:  35w 5d         64  %  Est. FW:    2498  gm      5 lb 8 oz     40  % ---------------------------------------------------------------------- Gestational  Age  LMP:           35w 5d       Date:   09/08/16                 EDD:   06/15/17  U/S Today:     35w 1d                                        EDD:   06/19/17  Best:          35w 5d    Det. By:   LMP  (09/08/16)          EDD:   06/15/17 ---------------------------------------------------------------------- Anatomy  Cranium:               Appears normal         Aortic Arch:  Previously seen  Cavum:                 Previously seen        Ductal Arch:            Previously seen  Ventricles:            Appears normal         Diaphragm:              Previously seen  Choroid Plexus:        Previously seen        Stomach:                Appears normal, left                                                                        sided  Cerebellum:            Previously seen        Abdomen:                Previously seen  Posterior Fossa:       Previously seen        Abdominal Wall:         Previously seen  Nuchal Fold:           Not applicable (>20    Cord Vessels:           Previously seen                         wks GA)  Face:                  Orbits and profile     Kidneys:                Appear normal                         previously seen  Lips:                  Previously seen        Bladder:                Appears normal  Thoracic:              Appears normal         Spine:                  Previously seen  Heart:                 Appears normal         Upper Extremities:      Previously seen                         (4CH, axis, and situs  RVOT:                  Appears normal         Lower Extremities:      Previously seen  LVOT:  Appears normal  Other:  Fetus appears to be a female. Heels and 5th digit previously visualized.          Technically difficult due to fetal position. Nasal bone visualized. ---------------------------------------------------------------------- Cervix Uterus Adnexa  Cervix  Not visualized (advanced GA >29wks)  Uterus  No abnormality visualized.  Left Ovary  No  adnexal mass visualized.  Right Ovary  No adnexal mass visualized.  Cul De Sac:   No free fluid seen.  Adnexa:       No abnormality visualized. ---------------------------------------------------------------------- Impression  Single living intrauterine pregnancy at [redacted]w[redacted]d.  Cephalic presentation.  Posterior placenta.  Appropriate interval fetal growth (40%).  Normal amniotic fluid volume.  Normal interval fetal anatomy.  BPP 8/8. ---------------------------------------------------------------------- Recommendations  Continue antenatal testing. ----------------------------------------------------------------------                   Darlyn Read, MD Electronically Signed Final Report   05/16/2017 10:36 am ----------------------------------------------------------------------   Assessment and Plan:  Pregnancy: [id number] at [redacted]w[redacted]d  1. Gestational diabetes mellitus (GDM) affecting pregnancy NST performed today was reviewed and was found to be reactive.  CBGs are all within range on just diet and exercise. No longer on Glyburide for about a week and polyhydramnios has resolved for now. Will still monitor weekly with NST and AFI.  May still consider IOL between 39-40 weeks.     2. Group B Streptococcus carrier, +RV culture, currently pregnant Will treat in labor; patient aware.  3. Supervision of high risk pregnancy in third trimester Preterm labor symptoms and general obstetric precautions including but not limited to vaginal bleeding, contractions, leaking of fluid and fetal movement were reviewed in detail with the patient. Please refer to After Visit Summary for other counseling recommendations.  Return in about 1 week (around 05/29/2017) for NST, AFI, OB Visit (HOB).   Jaynie Collins, MD

## 2017-05-29 ENCOUNTER — Ambulatory Visit (INDEPENDENT_AMBULATORY_CARE_PROVIDER_SITE_OTHER): Payer: Medicaid Other | Admitting: Obstetrics and Gynecology

## 2017-05-29 ENCOUNTER — Ambulatory Visit: Payer: Self-pay

## 2017-05-29 VITALS — BP 115/73 | HR 107 | Wt 222.7 lb

## 2017-05-29 DIAGNOSIS — O24419 Gestational diabetes mellitus in pregnancy, unspecified control: Secondary | ICD-10-CM

## 2017-05-29 DIAGNOSIS — O0993 Supervision of high risk pregnancy, unspecified, third trimester: Secondary | ICD-10-CM | POA: Diagnosis not present

## 2017-05-29 DIAGNOSIS — O99820 Streptococcus B carrier state complicating pregnancy: Secondary | ICD-10-CM | POA: Diagnosis not present

## 2017-05-29 NOTE — Progress Notes (Signed)
Pt informed that the ultrasound is considered a limited OB ultrasound and is not intended to be a complete ultrasound exam.  Patient also informed that the ultrasound is not being completed with the intent of assessing for fetal or placental anomalies or any pelvic abnormalities.  Explained that the purpose of today's ultrasound is to assess for presentation and amniotic fluid volume.  Patient acknowledges the purpose of the exam and the limitations of the study.    

## 2017-05-29 NOTE — Patient Instructions (Signed)
Vaginal Delivery Vaginal delivery means that you will give birth by pushing your baby out of your birth canal (vagina). A team of health care providers will help you before, during, and after vaginal delivery. Birth experiences are unique for every woman and every pregnancy, and birth experiences vary depending on where you choose to give birth. What should I do to prepare for my baby's birth? Before your baby is born, it is important to talk with your health care provider about:  Your labor and delivery preferences. These may include: ? Medicines that you may be given. ? How you will manage your pain. This might include non-medical pain relief techniques or injectable pain relief such as epidural analgesia. ? How you and your baby will be monitored during labor and delivery. ? Who may be in the labor and delivery room with you. ? Your feelings about surgical delivery of your baby (cesarean delivery, or C-section) if this becomes necessary. ? Your feelings about receiving donated blood through an IV tube (blood transfusion) if this becomes necessary.  Whether you are able: ? To take pictures or videos of the birth. ? To eat during labor and delivery. ? To move around, walk, or change positions during labor and delivery.  What to expect after your baby is born, such as: ? Whether delayed umbilical cord clamping and cutting is offered. ? Who will care for your baby right after birth. ? Medicines or tests that may be recommended for your baby. ? Whether breastfeeding is supported in your hospital or birth center. ? How long you will be in the hospital or birth center.  How any medical conditions you have may affect your baby or your labor and delivery experience.  To prepare for your baby's birth, you should also:  Attend all of your health care visits before delivery (prenatal visits) as recommended by your health care provider. This is important.  Prepare your home for your baby's  arrival. Make sure that you have: ? Diapers. ? Baby clothing. ? Feeding equipment. ? Safe sleeping arrangements for you and your baby.  Install a car seat in your vehicle. Have your car seat checked by a certified car seat installer to make sure that it is installed safely.  Think about who will help you with your new baby at home for at least the first several weeks after delivery.  What can I expect when I arrive at the birth center or hospital? Once you are in labor and have been admitted into the hospital or birth center, your health care provider may:  Review your pregnancy history and any concerns you have.  Insert an IV tube into one of your veins. This is used to give you fluids and medicines.  Check your blood pressure, pulse, temperature, and heart rate (vital signs).  Check whether your bag of water (amniotic sac) has broken (ruptured).  Talk with you about your birth plan and discuss pain control options.  Monitoring Your health care provider may monitor your contractions (uterine monitoring) and your baby's heart rate (fetal monitoring). You may need to be monitored:  Often, but not continuously (intermittently).  All the time or for long periods at a time (continuously). Continuous monitoring may be needed if: ? You are taking certain medicines, such as medicine to relieve pain or make your contractions stronger. ? You have pregnancy or labor complications.  Monitoring may be done by:  Placing a special stethoscope or a handheld monitoring device on your abdomen to   check your baby's heartbeat, and feeling your abdomen for contractions. This method of monitoring does not continuously record your baby's heartbeat or your contractions.  Placing monitors on your abdomen (external monitors) to record your baby's heartbeat and the frequency and length of contractions. You may not have to wear external monitors all the time.  Placing monitors inside of your uterus  (internal monitors) to record your baby's heartbeat and the frequency, length, and strength of your contractions. ? Your health care provider may use internal monitors if he or she needs more information about the strength of your contractions or your baby's heart rate. ? Internal monitors are put in place by passing a thin, flexible wire through your vagina and into your uterus. Depending on the type of monitor, it may remain in your uterus or on your baby's head until birth. ? Your health care provider will discuss the benefits and risks of internal monitoring with you and will ask for your permission before inserting the monitors.  Telemetry. This is a type of continuous monitoring that can be done with external or internal monitors. Instead of having to stay in bed, you are able to move around during telemetry. Ask your health care provider if telemetry is an option for you.  Physical exam Your health care provider may perform a physical exam. This may include:  Checking whether your baby is positioned: ? With the head toward your vagina (head-down). This is most common. ? With the head toward the top of your uterus (head-up or breech). If your baby is in a breech position, your health care provider may try to turn your baby to a head-down position so you can deliver vaginally. If it does not seem that your baby can be born vaginally, your provider may recommend surgery to deliver your baby. In rare cases, you may be able to deliver vaginally if your baby is head-up (breech delivery). ? Lying sideways (transverse). Babies that are lying sideways cannot be delivered vaginally.  Checking your cervix to determine: ? Whether it is thinning out (effacing). ? Whether it is opening up (dilating). ? How low your baby has moved into your birth canal.  What are the three stages of labor and delivery?  Normal labor and delivery is divided into the following three stages: Stage 1  Stage 1 is the  longest stage of labor, and it can last for hours or days. Stage 1 includes: ? Early labor. This is when contractions may be irregular, or regular and mild. Generally, early labor contractions are more than 10 minutes apart. ? Active labor. This is when contractions get longer, more regular, more frequent, and more intense. ? The transition phase. This is when contractions happen very close together, are very intense, and may last longer than during any other part of labor.  Contractions generally feel mild, infrequent, and irregular at first. They get stronger, more frequent (about every 2-3 minutes), and more regular as you progress from early labor through active labor and transition.  Many women progress through stage 1 naturally, but you may need help to continue making progress. If this happens, your health care provider may talk with you about: ? Rupturing your amniotic sac if it has not ruptured yet. ? Giving you medicine to help make your contractions stronger and more frequent.  Stage 1 ends when your cervix is completely dilated to 4 inches (10 cm) and completely effaced. This happens at the end of the transition phase. Stage 2  Once   your cervix is completely effaced and dilated to 4 inches (10 cm), you may start to feel an urge to push. It is common for the body to naturally take a rest before feeling the urge to push, especially if you received an epidural or certain other pain medicines. This rest period may last for up to 1-2 hours, depending on your unique labor experience.  During stage 2, contractions are generally less painful, because pushing helps relieve contraction pain. Instead of contraction pain, you may feel stretching and burning pain, especially when the widest part of your baby's head passes through the vaginal opening (crowning).  Your health care provider will closely monitor your pushing progress and your baby's progress through the vagina during stage 2.  Your  health care provider may massage the area of skin between your vaginal opening and anus (perineum) or apply warm compresses to your perineum. This helps it stretch as the baby's head starts to crown, which can help prevent perineal tearing. ? In some cases, an incision may be made in your perineum (episiotomy) to allow the baby to pass through the vaginal opening. An episiotomy helps to make the opening of the vagina larger to allow more room for the baby to fit through.  It is very important to breathe and focus so your health care provider can control the delivery of your baby's head. Your health care provider may have you decrease the intensity of your pushing, to help prevent perineal tearing.  After delivery of your baby's head, the shoulders and the rest of the body generally deliver very quickly and without difficulty.  Once your baby is delivered, the umbilical cord may be cut right away, or this may be delayed for 1-2 minutes, depending on your baby's health. This may vary among health care providers, hospitals, and birth centers.  If you and your baby are healthy enough, your baby may be placed on your chest or abdomen to help maintain the baby's temperature and to help you bond with each other. Some mothers and babies start breastfeeding at this time. Your health care team will dry your baby and help keep your baby warm during this time.  Your baby may need immediate care if he or she: ? Showed signs of distress during labor. ? Has a medical condition. ? Was born too early (prematurely). ? Had a bowel movement before birth (meconium). ? Shows signs of difficulty transitioning from being inside the uterus to being outside of the uterus. If you are planning to breastfeed, your health care team will help you begin a feeding. Stage 3  The third stage of labor starts immediately after the birth of your baby and ends after you deliver the placenta. The placenta is an organ that develops  during pregnancy to provide oxygen and nutrients to your baby in the womb.  Delivering the placenta may require some pushing, and you may have mild contractions. Breastfeeding can stimulate contractions to help you deliver the placenta.  After the placenta is delivered, your uterus should tighten (contract) and become firm. This helps to stop bleeding in your uterus. To help your uterus contract and to control bleeding, your health care provider may: ? Give you medicine by injection, through an IV tube, by mouth, or through your rectum (rectally). ? Massage your abdomen or perform a vaginal exam to remove any blood clots that are left in your uterus. ? Empty your bladder by placing a thin, flexible tube (catheter) into your bladder. ? Encourage   you to breastfeed your baby. After labor is over, you and your baby will be monitored closely to ensure that you are both healthy until you are ready to go home. Your health care team will teach you how to care for yourself and your baby. This information is not intended to replace advice given to you by your health care provider. Make sure you discuss any questions you have with your health care provider. Document Released: 09/03/2008 Document Revised: 06/14/2016 Document Reviewed: 12/10/2015 Elsevier Interactive Patient Education  2018 [employer]  

## 2017-05-29 NOTE — Progress Notes (Signed)
Subjective:  Camila LiMagdalene Carmen is a 31 y.o. G5P4004 at 623w4d being seen today for ongoing prenatal care.  She is currently monitored for the following issues for this high-risk pregnancy and has History of gestational diabetes; Gestational diabetes mellitus (GDM) affecting pregnancy; Supervision of high-risk pregnancy; Positive TB test; Abnormal quad screen; Multiparity; Anemia in pregnancy; and Group B Streptococcus carrier, +RV culture, currently pregnant on her problem list.  Patient reports no complaints.  Contractions: Irregular. Vag. Bleeding: None.  Movement: Present. Denies leaking of fluid.   The following portions of the patient's history were reviewed and updated as appropriate: allergies, current medications, past family history, past medical history, past social history, past surgical history and problem list. Problem list updated.  Objective:   Vitals:   05/29/17 0959  BP: 115/73  Pulse: (!) 107  Weight: 222 lb 11.2 oz (101 kg)    Fetal Status: Fetal Heart Rate (bpm): NST   Movement: Present  Presentation: Vertex  General:  Alert, oriented and cooperative. Patient is in no acute distress.  Skin: Skin is warm and dry. No rash noted.   Cardiovascular: Normal heart rate noted  Respiratory: Normal respiratory effort, no problems with respiration noted  Abdomen: Soft, gravid, appropriate for gestational age. Pain/Pressure: Present     Pelvic:  Cervical exam deferred        Extremities: Normal range of motion.     Mental Status: Normal mood and affect. Normal behavior. Normal judgment and thought content.   Urinalysis:      Assessment and Plan:  Pregnancy: G5P4004 at 183w4d  1. Gestational diabetes mellitus (GDM) affecting pregnancy BS in goal range RNST today Continue with antenatal testing Off DM meds now. IOL 39-40 weeks - Fetal nonstress test - US OB Limited - US MFM OB LIMITED; Future  2. Supervision of high risk pregnancy in third trimester Labor  precautions  3. Group B Streptococcus carrier, +RV culture, currently pregnant Tx in labor  Term labor symptoms and general obstetric precautions including but not limited to vaginal bleeding, contractions, leaking of fluid and fetal movement were reviewed in detail with the patient. Please refer to After Visit Summary for other counseling recommendations.  Return in about 1 week (around 06/05/2017) for Ob fu and NST - has US @ 1045.   Hermina StaggersErvin, Laksh Hinners L, MD

## 2017-06-05 ENCOUNTER — Ambulatory Visit (INDEPENDENT_AMBULATORY_CARE_PROVIDER_SITE_OTHER): Payer: Medicaid Other | Admitting: Obstetrics and Gynecology

## 2017-06-05 ENCOUNTER — Telehealth (HOSPITAL_COMMUNITY): Payer: Self-pay | Admitting: *Deleted

## 2017-06-05 ENCOUNTER — Ambulatory Visit (HOSPITAL_COMMUNITY)
Admission: RE | Admit: 2017-06-05 | Discharge: 2017-06-05 | Disposition: A | Discharge status: Home / Self Care | Payer: Medicaid Other | Source: Ambulatory Visit | Attending: Obstetrics and Gynecology | Admitting: Obstetrics and Gynecology

## 2017-06-05 ENCOUNTER — Encounter (HOSPITAL_COMMUNITY): Payer: Self-pay | Admitting: *Deleted

## 2017-06-05 ENCOUNTER — Other Ambulatory Visit: Payer: Self-pay | Admitting: Obstetrics and Gynecology

## 2017-06-05 VITALS — BP 104/70 | HR 79 | Wt 227.0 lb

## 2017-06-05 DIAGNOSIS — Z3A38 38 weeks gestation of pregnancy: Secondary | ICD-10-CM

## 2017-06-05 DIAGNOSIS — O0993 Supervision of high risk pregnancy, unspecified, third trimester: Secondary | ICD-10-CM

## 2017-06-05 DIAGNOSIS — O99820 Streptococcus B carrier state complicating pregnancy: Secondary | ICD-10-CM | POA: Diagnosis not present

## 2017-06-05 DIAGNOSIS — O280 Abnormal hematological finding on antenatal screening of mother: Secondary | ICD-10-CM

## 2017-06-05 DIAGNOSIS — O24419 Gestational diabetes mellitus in pregnancy, unspecified control: Secondary | ICD-10-CM

## 2017-06-05 NOTE — Telephone Encounter (Signed)
Preadmission screen  

## 2017-06-05 NOTE — Patient Instructions (Signed)
Vaginal Delivery Vaginal delivery means that you will give birth by pushing your baby out of your birth canal (vagina). A team of health care providers will help you before, during, and after vaginal delivery. Birth experiences are unique for every woman and every pregnancy, and birth experiences vary depending on where you choose to give birth. What should I do to prepare for my baby's birth? Before your baby is born, it is important to talk with your health care provider about:  Your labor and delivery preferences. These may include: ? Medicines that you may be given. ? How you will manage your pain. This might include non-medical pain relief techniques or injectable pain relief such as epidural analgesia. ? How you and your baby will be monitored during labor and delivery. ? Who may be in the labor and delivery room with you. ? Your feelings about surgical delivery of your baby (cesarean delivery, or C-section) if this becomes necessary. ? Your feelings about receiving donated blood through an IV tube (blood transfusion) if this becomes necessary.  Whether you are able: ? To take pictures or videos of the birth. ? To eat during labor and delivery. ? To move around, walk, or change positions during labor and delivery.  What to expect after your baby is born, such as: ? Whether delayed umbilical cord clamping and cutting is offered. ? Who will care for your baby right after birth. ? Medicines or tests that may be recommended for your baby. ? Whether breastfeeding is supported in your hospital or birth center. ? How long you will be in the hospital or birth center.  How any medical conditions you have may affect your baby or your labor and delivery experience.  To prepare for your baby's birth, you should also:  Attend all of your health care visits before delivery (prenatal visits) as recommended by your health care provider. This is important.  Prepare your home for your baby's  arrival. Make sure that you have: ? Diapers. ? Baby clothing. ? Feeding equipment. ? Safe sleeping arrangements for you and your baby.  Install a car seat in your vehicle. Have your car seat checked by a certified car seat installer to make sure that it is installed safely.  Think about who will help you with your new baby at home for at least the first several weeks after delivery.  What can I expect when I arrive at the birth center or hospital? Once you are in labor and have been admitted into the hospital or birth center, your health care provider may:  Review your pregnancy history and any concerns you have.  Insert an IV tube into one of your veins. This is used to give you fluids and medicines.  Check your blood pressure, pulse, temperature, and heart rate (vital signs).  Check whether your bag of water (amniotic sac) has broken (ruptured).  Talk with you about your birth plan and discuss pain control options.  Monitoring Your health care provider may monitor your contractions (uterine monitoring) and your baby's heart rate (fetal monitoring). You may need to be monitored:  Often, but not continuously (intermittently).  All the time or for long periods at a time (continuously). Continuous monitoring may be needed if: ? You are taking certain medicines, such as medicine to relieve pain or make your contractions stronger. ? You have pregnancy or labor complications.  Monitoring may be done by:  Placing a special stethoscope or a handheld monitoring device on your abdomen to   check your baby's heartbeat, and feeling your abdomen for contractions. This method of monitoring does not continuously record your baby's heartbeat or your contractions.  Placing monitors on your abdomen (external monitors) to record your baby's heartbeat and the frequency and length of contractions. You may not have to wear external monitors all the time.  Placing monitors inside of your uterus  (internal monitors) to record your baby's heartbeat and the frequency, length, and strength of your contractions. ? Your health care provider may use internal monitors if he or she needs more information about the strength of your contractions or your baby's heart rate. ? Internal monitors are put in place by passing a thin, flexible wire through your vagina and into your uterus. Depending on the type of monitor, it may remain in your uterus or on your baby's head until birth. ? Your health care provider will discuss the benefits and risks of internal monitoring with you and will ask for your permission before inserting the monitors.  Telemetry. This is a type of continuous monitoring that can be done with external or internal monitors. Instead of having to stay in bed, you are able to move around during telemetry. Ask your health care provider if telemetry is an option for you.  Physical exam Your health care provider may perform a physical exam. This may include:  Checking whether your baby is positioned: ? With the head toward your vagina (head-down). This is most common. ? With the head toward the top of your uterus (head-up or breech). If your baby is in a breech position, your health care provider may try to turn your baby to a head-down position so you can deliver vaginally. If it does not seem that your baby can be born vaginally, your provider may recommend surgery to deliver your baby. In rare cases, you may be able to deliver vaginally if your baby is head-up (breech delivery). ? Lying sideways (transverse). Babies that are lying sideways cannot be delivered vaginally.  Checking your cervix to determine: ? Whether it is thinning out (effacing). ? Whether it is opening up (dilating). ? How low your baby has moved into your birth canal.  What are the three stages of labor and delivery?  Normal labor and delivery is divided into the following three stages: Stage 1  Stage 1 is the  longest stage of labor, and it can last for hours or days. Stage 1 includes: ? Early labor. This is when contractions may be irregular, or regular and mild. Generally, early labor contractions are more than 10 minutes apart. ? Active labor. This is when contractions get longer, more regular, more frequent, and more intense. ? The transition phase. This is when contractions happen very close together, are very intense, and may last longer than during any other part of labor.  Contractions generally feel mild, infrequent, and irregular at first. They get stronger, more frequent (about every 2-3 minutes), and more regular as you progress from early labor through active labor and transition.  Many women progress through stage 1 naturally, but you may need help to continue making progress. If this happens, your health care provider may talk with you about: ? Rupturing your amniotic sac if it has not ruptured yet. ? Giving you medicine to help make your contractions stronger and more frequent.  Stage 1 ends when your cervix is completely dilated to 4 inches (10 cm) and completely effaced. This happens at the end of the transition phase. Stage 2  Once   your cervix is completely effaced and dilated to 4 inches (10 cm), you may start to feel an urge to push. It is common for the body to naturally take a rest before feeling the urge to push, especially if you received an epidural or certain other pain medicines. This rest period may last for up to 1-2 hours, depending on your unique labor experience.  During stage 2, contractions are generally less painful, because pushing helps relieve contraction pain. Instead of contraction pain, you may feel stretching and burning pain, especially when the widest part of your baby's head passes through the vaginal opening (crowning).  Your health care provider will closely monitor your pushing progress and your baby's progress through the vagina during stage 2.  Your  health care provider may massage the area of skin between your vaginal opening and anus (perineum) or apply warm compresses to your perineum. This helps it stretch as the baby's head starts to crown, which can help prevent perineal tearing. ? In some cases, an incision may be made in your perineum (episiotomy) to allow the baby to pass through the vaginal opening. An episiotomy helps to make the opening of the vagina larger to allow more room for the baby to fit through.  It is very important to breathe and focus so your health care provider can control the delivery of your baby's head. Your health care provider may have you decrease the intensity of your pushing, to help prevent perineal tearing.  After delivery of your baby's head, the shoulders and the rest of the body generally deliver very quickly and without difficulty.  Once your baby is delivered, the umbilical cord may be cut right away, or this may be delayed for 1-2 minutes, depending on your baby's health. This may vary among health care providers, hospitals, and birth centers.  If you and your baby are healthy enough, your baby may be placed on your chest or abdomen to help maintain the baby's temperature and to help you bond with each other. Some mothers and babies start breastfeeding at this time. Your health care team will dry your baby and help keep your baby warm during this time.  Your baby may need immediate care if he or she: ? Showed signs of distress during labor. ? Has a medical condition. ? Was born too early (prematurely). ? Had a bowel movement before birth (meconium). ? Shows signs of difficulty transitioning from being inside the uterus to being outside of the uterus. If you are planning to breastfeed, your health care team will help you begin a feeding. Stage 3  The third stage of labor starts immediately after the birth of your baby and ends after you deliver the placenta. The placenta is an organ that develops  during pregnancy to provide oxygen and nutrients to your baby in the womb.  Delivering the placenta may require some pushing, and you may have mild contractions. Breastfeeding can stimulate contractions to help you deliver the placenta.  After the placenta is delivered, your uterus should tighten (contract) and become firm. This helps to stop bleeding in your uterus. To help your uterus contract and to control bleeding, your health care provider may: ? Give you medicine by injection, through an IV tube, by mouth, or through your rectum (rectally). ? Massage your abdomen or perform a vaginal exam to remove any blood clots that are left in your uterus. ? Empty your bladder by placing a thin, flexible tube (catheter) into your bladder. ? Encourage   you to breastfeed your baby. After labor is over, you and your baby will be monitored closely to ensure that you are both healthy until you are ready to go home. Your health care team will teach you how to care for yourself and your baby. This information is not intended to replace advice given to you by your health care provider. Make sure you discuss any questions you have with your health care provider. Document Released: 09/03/2008 Document Revised: 06/14/2016 Document Reviewed: 12/10/2015 Elsevier Interactive Patient Education  2018 [employer]  

## 2017-06-05 NOTE — Progress Notes (Signed)
Subjective:  Camila LiMagdalene Yadao is a 31 y.o. G5P4004 at 3280w4d being seen today for ongoing prenatal care.  She is currently monitored for the following issues for this high-risk pregnancy and has History of gestational diabetes; Gestational diabetes mellitus (GDM) affecting pregnancy; Supervision of high-risk pregnancy; Positive TB test; Abnormal quad screen; Multiparity; Anemia in pregnancy; and Group B Streptococcus carrier, +RV culture, currently pregnant on her problem list.  Patient reports no complaints.  Contractions: Irregular. Vag. Bleeding: None.  Movement: Present. Denies leaking of fluid.   The following portions of the patient's history were reviewed and updated as appropriate: allergies, current medications, past family history, past medical history, past social history, past surgical history and problem list. Problem list updated.  Objective:   Vitals:   06/05/17 0957  BP: 104/70  Pulse: 79  Weight: 227 lb (103 kg)    Fetal Status: Fetal Heart Rate (bpm): NST   Movement: Present     General:  Alert, oriented and cooperative. Patient is in no acute distress.  Skin: Skin is warm and dry. No rash noted.   Cardiovascular: Normal heart rate noted  Respiratory: Normal respiratory effort, no problems with respiration noted  Abdomen: Soft, gravid, appropriate for gestational age. Pain/Pressure: Present     Pelvic:  Cervical exam deferred        Extremities: Normal range of motion.  Edema: None  Mental Status: Normal mood and affect. Normal behavior. Normal judgment and thought content.   Urinalysis:      Assessment and Plan:  Pregnancy: G5P4004 at 6180w4d  1. Gestational diabetes mellitus (GDM) affecting pregnancy BS in goal range, without meds RNST today AFI in MFM IOL at 40 weeks Continue with antenatal testing - Fetal nonstress test  2. Supervision of high risk pregnancy in third trimester Labor precautions  3. Abnormal quad screen NIPS nl  4. Group B Streptococcus  carrier, +RV culture, currently pregnant Tx while in labor  Term labor symptoms and general obstetric precautions including but not limited to vaginal bleeding, contractions, leaking of fluid and fetal movement were reviewed in detail with the patient. Please refer to After Visit Summary for other counseling recommendations.  Return in about 1 week (around 06/12/2017) for NST.   Hermina StaggersErvin, Isaiyah Feldhaus L, MD

## 2017-06-11 ENCOUNTER — Other Ambulatory Visit: Payer: Self-pay | Admitting: Advanced Practice Midwife

## 2017-06-13 ENCOUNTER — Ambulatory Visit (INDEPENDENT_AMBULATORY_CARE_PROVIDER_SITE_OTHER): Payer: Medicaid Other | Admitting: Obstetrics & Gynecology

## 2017-06-13 ENCOUNTER — Ambulatory Visit: Payer: Self-pay

## 2017-06-13 VITALS — BP 115/79 | HR 94 | Wt 230.7 lb

## 2017-06-13 DIAGNOSIS — O24419 Gestational diabetes mellitus in pregnancy, unspecified control: Secondary | ICD-10-CM

## 2017-06-13 DIAGNOSIS — O0993 Supervision of high risk pregnancy, unspecified, third trimester: Secondary | ICD-10-CM | POA: Diagnosis not present

## 2017-06-13 NOTE — Progress Notes (Signed)
IOL scheduled 7/8 @ 0730.  Pt informed that the ultrasound is considered a limited OB ultrasound and is not intended to be a complete ultrasound exam.  Patient also informed that the ultrasound is not being completed with the intent of assessing for fetal or placental anomalies or any pelvic abnormalities.  Explained that the purpose of today's ultrasound is to assess for presentation and amniotic fluid volume.  Patient acknowledges the purpose of the exam and the limitations of the study.

## 2017-06-15 ENCOUNTER — Encounter (HOSPITAL_COMMUNITY): Payer: Self-pay

## 2017-06-15 ENCOUNTER — Inpatient Hospital Stay (HOSPITAL_COMMUNITY)
Admission: RE | Admit: 2017-06-15 | Discharge: 2017-06-17 | DRG: 775 | Disposition: A | Discharge status: Home / Self Care | Payer: BLUE CROSS/BLUE SHIELD | Source: Ambulatory Visit | Attending: Obstetrics & Gynecology | Admitting: Obstetrics & Gynecology

## 2017-06-15 VITALS — BP 123/80 | HR 85 | Temp 98.0°F | Resp 16 | Ht 69.0 in | Wt 231.0 lb

## 2017-06-15 DIAGNOSIS — O0993 Supervision of high risk pregnancy, unspecified, third trimester: Secondary | ICD-10-CM

## 2017-06-15 DIAGNOSIS — Z3A40 40 weeks gestation of pregnancy: Secondary | ICD-10-CM | POA: Diagnosis not present

## 2017-06-15 DIAGNOSIS — Z641 Problems related to multiparity: Secondary | ICD-10-CM

## 2017-06-15 DIAGNOSIS — Z8249 Family history of ischemic heart disease and other diseases of the circulatory system: Secondary | ICD-10-CM

## 2017-06-15 DIAGNOSIS — O24425 Gestational diabetes mellitus in childbirth, controlled by oral hypoglycemic drugs: Principal | ICD-10-CM | POA: Diagnosis present

## 2017-06-15 DIAGNOSIS — D649 Anemia, unspecified: Secondary | ICD-10-CM | POA: Diagnosis present

## 2017-06-15 DIAGNOSIS — O280 Abnormal hematological finding on antenatal screening of mother: Secondary | ICD-10-CM

## 2017-06-15 DIAGNOSIS — R7611 Nonspecific reaction to tuberculin skin test without active tuberculosis: Secondary | ICD-10-CM

## 2017-06-15 DIAGNOSIS — O24419 Gestational diabetes mellitus in pregnancy, unspecified control: Secondary | ICD-10-CM

## 2017-06-15 DIAGNOSIS — O99824 Streptococcus B carrier state complicating childbirth: Secondary | ICD-10-CM | POA: Diagnosis present

## 2017-06-15 DIAGNOSIS — O9902 Anemia complicating childbirth: Secondary | ICD-10-CM | POA: Diagnosis present

## 2017-06-15 DIAGNOSIS — O99820 Streptococcus B carrier state complicating pregnancy: Secondary | ICD-10-CM

## 2017-06-15 DIAGNOSIS — O24415 Gestational diabetes mellitus in pregnancy, controlled by oral hypoglycemic drugs: Secondary | ICD-10-CM

## 2017-06-15 LAB — CBC
HEMATOCRIT: 30 % — AB (ref 36.0–46.0)
HEMOGLOBIN: 9.3 g/dL — AB (ref 12.0–15.0)
MCH: 21.8 pg — ABNORMAL LOW (ref 26.0–34.0)
MCHC: 31 g/dL (ref 30.0–36.0)
MCV: 70.3 fL — AB (ref 78.0–100.0)
Platelets: 231 10*3/uL (ref 150–400)
RBC: 4.27 MIL/uL (ref 3.87–5.11)
RDW: 17.7 % — AB (ref 11.5–15.5)
WBC: 7 10*3/uL (ref 4.0–10.5)

## 2017-06-15 LAB — RPR: RPR Ser Ql: NONREACTIVE

## 2017-06-15 LAB — TYPE AND SCREEN
ABO/RH(D): B POS
ANTIBODY SCREEN: NEGATIVE

## 2017-06-15 LAB — GLUCOSE, CAPILLARY
GLUCOSE-CAPILLARY: 108 mg/dL — AB (ref 65–99)
GLUCOSE-CAPILLARY: 78 mg/dL (ref 65–99)
Glucose-Capillary: 74 mg/dL (ref 65–99)

## 2017-06-15 MED ORDER — DIPHENHYDRAMINE HCL 25 MG PO CAPS: 25.0000 mg | ORAL_CAPSULE | Freq: Four times a day (QID) | ORAL | Status: DC | PRN

## 2017-06-15 MED ORDER — SENNOSIDES-DOCUSATE SODIUM 8.6-50 MG PO TABS
2.0000 | ORAL_TABLET | ORAL | Status: DC
  Administered 2017-06-15 – 2017-06-16 (×2): 2 via ORAL
  Filled 2017-06-15 (×2): qty 2

## 2017-06-15 MED ORDER — LIDOCAINE HCL (PF) 1 % IJ SOLN
30.0000 mL | INTRAMUSCULAR | Status: DC | PRN
  Administered 2017-06-15: 30 mL via SUBCUTANEOUS
  Filled 2017-06-15: qty 30

## 2017-06-15 MED ORDER — OXYCODONE-ACETAMINOPHEN 5-325 MG PO TABS: 1.0000 | ORAL_TABLET | ORAL | Status: DC | PRN

## 2017-06-15 MED ORDER — OXYTOCIN 40 UNITS IN LACTATED RINGERS INFUSION - SIMPLE MED
1.0000 m[IU]/min | INTRAVENOUS | Status: DC
  Administered 2017-06-15: 2 m[IU]/min via INTRAVENOUS

## 2017-06-15 MED ORDER — BENZOCAINE-MENTHOL 20-0.5 % EX AERO: 1.0000 "application " | INHALATION_SPRAY | CUTANEOUS | Status: DC | PRN

## 2017-06-15 MED ORDER — PENICILLIN G POT IN DEXTROSE 60000 UNIT/ML IV SOLN
3.0000 10*6.[IU] | INTRAVENOUS | Status: DC
  Administered 2017-06-15 (×2): 3 10*6.[IU] via INTRAVENOUS
  Filled 2017-06-15 (×4): qty 50

## 2017-06-15 MED ORDER — ZOLPIDEM TARTRATE 5 MG PO TABS: 5.0000 mg | ORAL_TABLET | Freq: Every evening | ORAL | Status: DC | PRN

## 2017-06-15 MED ORDER — TERBUTALINE SULFATE 1 MG/ML IJ SOLN
0.2500 mg | Freq: Once | INTRAMUSCULAR | Status: DC | PRN
  Filled 2017-06-15: qty 1

## 2017-06-15 MED ORDER — WITCH HAZEL-GLYCERIN EX PADS: 1.0000 "application " | MEDICATED_PAD | CUTANEOUS | Status: DC | PRN

## 2017-06-15 MED ORDER — IBUPROFEN 600 MG PO TABS
600.0000 mg | ORAL_TABLET | Freq: Four times a day (QID) | ORAL | Status: DC
  Administered 2017-06-15 – 2017-06-17 (×7): 600 mg via ORAL
  Filled 2017-06-15 (×7): qty 1

## 2017-06-15 MED ORDER — DIBUCAINE 1 % RE OINT: 1.0000 "application " | TOPICAL_OINTMENT | RECTAL | Status: DC | PRN

## 2017-06-15 MED ORDER — OXYTOCIN 40 UNITS IN LACTATED RINGERS INFUSION - SIMPLE MED: 1.0000 m[IU]/min | INTRAVENOUS | Status: DC

## 2017-06-15 MED ORDER — LACTATED RINGERS IV SOLN
INTRAVENOUS | Status: DC
  Administered 2017-06-15 (×2): via INTRAVENOUS

## 2017-06-15 MED ORDER — SOD CITRATE-CITRIC ACID 500-334 MG/5ML PO SOLN: 30.0000 mL | ORAL | Status: DC | PRN

## 2017-06-15 MED ORDER — MISOPROSTOL 25 MCG QUARTER TABLET
25.0000 ug | ORAL_TABLET | ORAL | Status: DC
  Administered 2017-06-15: 25 ug via VAGINAL
  Filled 2017-06-15: qty 1

## 2017-06-15 MED ORDER — TERBUTALINE SULFATE 1 MG/ML IJ SOLN: 0.2500 mg | Freq: Once | INTRAMUSCULAR | Status: DC | PRN

## 2017-06-15 MED ORDER — ACETAMINOPHEN 325 MG PO TABS: 650.0000 mg | ORAL_TABLET | ORAL | Status: DC | PRN

## 2017-06-15 MED ORDER — ONDANSETRON HCL 4 MG/2ML IJ SOLN: 4.0000 mg | Freq: Four times a day (QID) | INTRAMUSCULAR | Status: DC | PRN

## 2017-06-15 MED ORDER — FENTANYL CITRATE (PF) 100 MCG/2ML IJ SOLN: 100.0000 ug | INTRAMUSCULAR | Status: DC | PRN

## 2017-06-15 MED ORDER — PRENATAL MULTIVITAMIN CH
1.0000 | ORAL_TABLET | Freq: Every day | ORAL | Status: DC
  Administered 2017-06-16 – 2017-06-17 (×2): 1 via ORAL
  Filled 2017-06-15 (×2): qty 1

## 2017-06-15 MED ORDER — SIMETHICONE 80 MG PO CHEW: 80.0000 mg | CHEWABLE_TABLET | ORAL | Status: DC | PRN

## 2017-06-15 MED ORDER — ACETAMINOPHEN 325 MG PO TABS
650.0000 mg | ORAL_TABLET | ORAL | Status: DC | PRN
  Administered 2017-06-15: 650 mg via ORAL
  Filled 2017-06-15: qty 2

## 2017-06-15 MED ORDER — OXYTOCIN BOLUS FROM INFUSION
500.0000 mL | Freq: Once | INTRAVENOUS | Status: AC
  Administered 2017-06-15: 500 mL via INTRAVENOUS

## 2017-06-15 MED ORDER — COCONUT OIL OIL: 1.0000 "application " | TOPICAL_OIL | Status: DC | PRN

## 2017-06-15 MED ORDER — ONDANSETRON HCL 4 MG/2ML IJ SOLN: 4.0000 mg | INTRAMUSCULAR | Status: DC | PRN

## 2017-06-15 MED ORDER — LACTATED RINGERS IV SOLN
500.0000 mL | INTRAVENOUS | Status: DC | PRN
  Administered 2017-06-15: 500 mL via INTRAVENOUS

## 2017-06-15 MED ORDER — PENICILLIN G POTASSIUM 5000000 UNITS IJ SOLR
5.0000 10*6.[IU] | Freq: Once | INTRAMUSCULAR | Status: AC
  Administered 2017-06-15: 5 10*6.[IU] via INTRAVENOUS
  Filled 2017-06-15: qty 5

## 2017-06-15 MED ORDER — OXYTOCIN 40 UNITS IN LACTATED RINGERS INFUSION - SIMPLE MED
2.5000 [IU]/h | INTRAVENOUS | Status: DC
  Filled 2017-06-15: qty 1000

## 2017-06-15 MED ORDER — OXYCODONE-ACETAMINOPHEN 5-325 MG PO TABS: 2.0000 | ORAL_TABLET | ORAL | Status: DC | PRN

## 2017-06-15 MED ORDER — ONDANSETRON HCL 4 MG PO TABS: 4.0000 mg | ORAL_TABLET | ORAL | Status: DC | PRN

## 2017-06-15 MED ORDER — TETANUS-DIPHTH-ACELL PERTUSSIS 5-2.5-18.5 LF-MCG/0.5 IM SUSP
0.5000 mL | Freq: Once | INTRAMUSCULAR | Status: DC
  Filled 2017-06-15: qty 0.5

## 2017-06-15 NOTE — Progress Notes (Signed)
Patient ID: Carly Cabrera, female   DOB: 07/15/1986, 31 y.o.   MRN: [id number] Labor Progress Note  S: Patient seen & examined for progress of labor. Patient comfortable. Feeling contractions more. No epidural.   O: BP (!) 118/47   Pulse 71   Temp 97.8 F (36.6 C) (Oral)   Resp 18   Ht 5\' 9"  (1.753 m)   Wt 104.8 kg (231 lb)   LMP 09/08/2016   BMI 34.11 kg/m   FHT: 150bpm, mod var, absent accels, late decels TOCO: q2-8 min, patient looks comfortable during contractions  CVE: Dilation: 2.5 Effacement (%): Thick Cervical Position: Posterior Station: -3, -2 Presentation: Vertex Exam by:: Camelia EngK. Haynes, RN   A&P: 31 y.o. [phone] 6437w0d here for IOL for A2GDM  Currently on 2 milli-units/min pitocin started at 1230 Continue induction management Anticipate SVD  [place] Shirley, DO FM Resident PGY-1 06/15/2017 12:51 PM

## 2017-06-15 NOTE — Progress Notes (Signed)
Patient ID: Carly Cabrera, female   DOB: 01/24/1986, 31 y.o.   MRN: [id number] Labor Progress Note  S: Patient seen & examined for progress of labor. Patient comfortable with no epidural.   O: BP 120/79   Pulse 81   Temp 98.7 F (37.1 C) (Oral)   Resp 18   Ht 5\' 9"  (1.753 m)   Wt 104.8 kg (231 lb)   LMP 09/08/2016   BMI 34.11 kg/m   FHT: 135bpm, mod var, +accels, variable decels TOCO: q1-493min, patient looks comfortable during contractions  CVE: Dilation: Lip/rim Effacement (%): 100 Cervical Position: Posterior Station: -2 Presentation: Vertex Exam by:: Hart RochesterLawson, CNM   A&P: 31 y.o. [id number] 4118w0d IOL for A2GDM  Currently on 10 milli-units of pitocin, AROM @1645  with moderate amount of clear fluid Continue expectant management Anticipate SVD  [place] Shirley, DO FM Resident PGY-1 06/15/2017 4:53 PM

## 2017-06-15 NOTE — Anesthesia Pain Management Evaluation Note (Signed)
  [facility]NA Pain Management Visit Note  Patient: Carly Cabrera, [age] y.o., female  "Hello I am a member of the anesthesia team at [facility]. We have an anesthesia team available at all times to provide care throughout the hospital, including epidural management and anesthesia for C-section. I don't know your plan for the delivery whether it a natural birth, water birth, IV sedation, nitrous supplementation, doula or epidural, but we want to meet your pain goals."   1.Was your pain managed to your expectations on prior hospitalizations?   Yes   2.What is your expectation for pain management during this hospitalization?     Labor support without medications  3.How can we help you reach that goal? unsure  Record the patient's initial score and the patient's pain goal.   Pain: 0  Pain Goal: 6 [facility] wants you to be able to say your pain was always managed very well.  Carly Cabrera,Carly Cabrera 06/15/2017

## 2017-06-15 NOTE — Progress Notes (Signed)
Patient ID: Carly Cabrera, female   DOB: 08/30/1986, 31 y.o.   MRN: [id number] Labor Progress Note  S: Patient examined for progress of labor. Patient without epidural, pain now at a 7 with contractions.   O: BP 122/89   Pulse 73   Temp 97.8 F (36.6 C) (Oral)   Resp 18   Ht 5\' 9"  (1.753 m)   Wt 104.8 kg (231 lb)   LMP 09/08/2016   BMI 34.11 kg/m   FHT: 130bpm, mod var, +accels, no decels TOCO: q1-514min, patient looks comfortable during contractions  CVE: Dilation: 2.5 Effacement (%): Thick Cervical Position: Posterior Station: -3, -2 Presentation: Vertex Exam by:: Camelia EngK. Haynes, RN   A&P: 31 y.o. [id number] 7586w0d here for IOL for A2GDM  Currently on 10 milli-units of pitocin.  Plan for AROM Continue with management Anticipate SVD  [place] Shirley, DO FM Resident PGY-1 06/15/2017 4:26 PM

## 2017-06-15 NOTE — H&P (Signed)
LABOR ADMISSION HISTORY AND PHYSICAL  Carly Cabrera is a 31 y.o. female [phone] with IUP at 59w0dby LMP presenting for IOL with A2GDM. She reports +FMs, No LOF, no VB, no blurry vision, headaches or peripheral edema, and RUQ pain.  She plans on breast feeding. She request nexplanon for birth control.  Dating: By LMP --->  Estimated Date of Delivery: 06/15/17   Prenatal History/Complications:  Past Medical History: Past Medical History:  Diagnosis Date  . Gestational diabetes   . Gestational diabetes mellitus in pregnancy     Past Surgical History: Past Surgical History:  Procedure Laterality Date  . NO PAST SURGERIES      Obstetrical History: OB History    Gravida Para Term Preterm AB Living   _0 0 0 4   SAB TAB Ectopic Multiple Live Births   0 0 0 0 4      Social History: Social History   Social History  . Marital status: Married    Spouse name: N/A  . Number of children: N/A  . Years of education: N/A   Social History Main Topics  . Smoking status: Never Smoker  . Smokeless tobacco: Never Used  . Alcohol use No  . Drug use: No  . Sexual activity: Not Currently   Other Topics Concern  . None   Social History Narrative  . None    Family History: Family History  Problem Relation Age of Onset  . Hypertension Father   . Diabetes Father     Allergies: No Known Allergies  Prescriptions Prior to Admission  Medication Sig Dispense Refill Last Dose  . ACCU-CHEK FASTCLIX LANCETS MISC Check your blood sugar 4 times daily: fasting in the morning, and 2 hours after each meal. 120 each 12 Taking  . Blood Glucose Monitoring Suppl (ACCU-CHEK GUIDE) w/Device KIT 1 kit by Does not apply route 4 (four) times daily. 1 kit 0 Taking  . glucose blood (ACCU-CHEK GUIDE) test strip Use as instructed 100 each 12 Taking  . glyBURIDE (DIABETA) 2.5 MG tablet Take 1 tablet (2.5 mg total) by mouth 2 (two) times daily. (Patient not taking: Reported on 05/16/2017) 30 tablet 3  Not Taking  . Prenatal Vit-Fe Fumarate-FA (MULTIVITAMIN-PRENATAL) 27-0.8 MG TABS tablet Take 1 tablet by mouth daily at 12 noon.   Taking     Review of Systems   All systems reviewed and negative except as stated in HPI  Blood pressure 123/90, pulse (!) 107, temperature 99 F (37.2 C), temperature source Oral, resp. rate 18, height 5' 9" (1.753 m), weight 104.8 kg (231 lb), last menstrual period 09/08/2016, unknown if currently breastfeeding. General appearance: alert and cooperative Extremities: Homans sign is negative, no sign of DVT Presentation: cephalic Fetal monitoringBaseline: 135 bpm, Variability: Good {> 6 bpm), Accelerations: Reactive and Decelerations: Absent Uterine activityFrequency: Mild contractions Dilation: 1 Effacement (%): Thick Station: -3, -2 Exam by:: KMargarette Asal  Prenatal labs: ABO, Rh: --/--/B POS (07/08 0730) Antibody: NEG (07/08 0730) Rubella: Immune RPR: Non Reactive (04/09 1014)  HBsAg: Negative (01/29 0000)  HIV: Non Reactive (04/09 1014)  GBS: Positive (06/06 0000)  1 hr Glucola 176  Prenatal Transfer Tool  Maternal Diabetes: Yes:  Diabetes Type:  Insulin/Medication controlled Genetic Screening: Abnormal:  Results: Trisomy 21, Other:NIPS normal Maternal Ultrasounds/Referrals: Normal Fetal Ultrasounds or other Referrals:  None Maternal Substance Abuse:  No Significant Maternal Medications:  Meds include: Other: Glyburide- stopped one month ago Significant Maternal Lab Results: Lab values include: Group  B Strep positive  Results for orders placed or performed during the hospital encounter of 06/15/17 (from the past 24 hour(s))  CBC   Collection Time: 06/15/17  7:30 AM  Result Value Ref Range   WBC 7.0 4.0 - 10.5 K/uL   RBC 4.27 3.87 - 5.11 MIL/uL   Hemoglobin 9.3 (L) 12.0 - 15.0 g/dL   HCT 30.0 (L) 36.0 - 46.0 %   MCV 70.3 (L) 78.0 - 100.0 fL   MCH 21.8 (L) 26.0 - 34.0 pg   MCHC 31.0 30.0 - 36.0 g/dL   RDW 17.7 (H) 11.5 - 15.5 %    Platelets 231 150 - 400 K/uL  Type and screen   Collection Time: 06/15/17  7:30 AM  Result Value Ref Range   ABO/RH(D) B POS    Antibody Screen NEG    Sample Expiration 06/18/2017   Glucose, capillary   Collection Time: 06/15/17  7:53 AM  Result Value Ref Range   Glucose-Capillary 108 (H) 65 - 99 mg/dL    Patient Active Problem List   Diagnosis Date Noted  . Gestational diabetes 06/15/2017  . Group B Streptococcus carrier, +RV culture, currently pregnant 05/22/2017  . Anemia in pregnancy 03/23/2017  . Multiparity 01/27/2017  . Gestational diabetes mellitus (GDM) affecting pregnancy 01/22/2017  . Supervision of high-risk pregnancy 01/22/2017  . 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 Abnormal quad screen 01/22/2017  . History of gestational diabetes 07/28/2015    Assessment: Carly Cabrera is a 31 y.o. G5P4004 at 58w0dhere for IOL with A2GDM (glyburide- stopped one month ago per patient)  #Labor: Induction protocol #Pain: Epidural upon request #FWB: Cat 1 #ID: GBS positive- PCN #MOF: Breast #MOC:Nexplanon #Circ:  inpatient  JMartiniqueShirley, DO Family Medicine Resident PGY-1  06/15/2017, 9:03 AM

## 2017-06-16 NOTE — Lactation Note (Signed)
This note was copied from a baby's chart. Lactation Consultation Note: Mother is experienced breastfeeding mother for 1-2 yrs with each child. Mother reports that infant is breastfeeding well. Assist mother with hand expression and breast massage. No observed colostrum when hand expresses. Advised mother to continue to practice hand expression. [facility]. [facility] advised to [facility] with needed assistance or questions. Mother receptive to all teaching.   Patient Name: [name] [id number]'YToday's Date: 06/16/2017 Reason for consult: Initial assessment   Maternal Data Has patient been taught Hand Expression?: Yes Does the patient have breastfeeding experience prior to this delivery?: Yes  Feeding    LATCH Score/Interventions                      Lactation Tools Discussed/Used     Consult Status Consult Status: Follow-up Date: 06/16/17 Follow-up type: In-patient    Stevan BornKendrick, [facility] 06/16/2017, 2:33 PM

## 2017-06-16 NOTE — Progress Notes (Signed)
UR chart review completed.  

## 2017-06-16 NOTE — Plan of Care (Signed)
Problem: Nutritional: Goal: Mothers verbalization of comfort with breastfeeding process will improve Outcome: Progressing Pt verbalizes comfort with latching the baby but she states that she feels that she has no milk.  Hand expression taught and RN was able to get a shimmer of colostrum from the right nipple.  Baby latched in the football hold position without difficulty and occasional swallows heard.  RN showed/described what swallows were.  Pt verbalized understanding. Pt denies nipple soreness but was tender with hand expression.  Encouraged pt to continue with breastfeeding and call RN as needed for help latching or if she has any concerns.  Pt verbalized understanding.

## 2017-06-16 NOTE — Progress Notes (Signed)
Post Partum Day #1 Subjective: no complaints, up ad lib and tolerating PO; breastfeeding going well; considering Nexplanon for pp contraception  Objective: Blood pressure 123/76, pulse 70, temperature 98.1 F (36.7 C), temperature source Oral, resp. rate 16, height 5\' 9"  (1.753 m), weight 104.8 kg (231 lb), last menstrual period 09/08/2016, unknown if currently breastfeeding.  Physical Exam:  General: alert, cooperative and no distress Lochia: appropriate Uterine Fundus: firm DVT Evaluation: No evidence of DVT seen on physical exam.   Recent Labs  06/15/17 0730  HGB 9.3*  HCT 30.0*    Assessment/Plan: Plan for discharge tomorrow   LOS: 1 day   Reannah Totten CNM 06/16/2017, 8:16 AM

## 2017-06-17 MED ORDER — IBUPROFEN 600 MG PO TABS: 600.0000 mg | ORAL_TABLET | Freq: Four times a day (QID) | ORAL | 0 refills | Status: DC | PRN

## 2017-06-17 NOTE — Discharge Summary (Signed)
OB Discharge Summary     Patient Name: Carly Cabrera DOB: [birth year] MRN: [id number]  Date of admission: 06/15/2017 Delivering MD: Koren Shiver D   Date of discharge: 06/17/2017  Admitting diagnosis: 40wks induction Intrauterine pregnancy: [redacted]w[redacted]d    Secondary diagnosis:  Active Problems:   Gestational diabetes   NSVD (normal spontaneous vaginal delivery)  Additional problems: none     Discharge diagnosis: Term Pregnancy Delivered and GDM A2                                                                                                Post partum procedures:none  Augmentation: AROM, Pitocin and Cytotec  Complications: None  Hospital course:  Induction of Labor With Vaginal Delivery   31y.o. yo G5P5005 at 446w0das admitted to the hospital 06/15/2017 for induction of labor.  Indication for induction: A2 DM.  Patient had an uncomplicated labor course as follows: Membrane Rupture Time/Date: 4:45 PM ,06/15/2017   Intrapartum Procedures: Episiotomy: None [1]                                         Lacerations:  2nd degree [3];Perineal [11]  Patient had delivery of a Viable infant.  Information for the patient's newborn:  Carly CabreraDelivery Method: Vag-Spont   06/15/2017  Details of delivery can be found in separate delivery note.  Patient had a routine postpartum course. Patient is discharged home 06/17/17.  Physical exam  Vitals:   06/16/17 0519 06/16/17 0730 06/16/17 1918 06/17/17 0521  BP:    123/80  Pulse:    85  Resp:    16  Temp: 98.2 F (36.8 C) 98.1 F (36.7 C) 98.7 F (37.1 C) 98 F (36.7 C)  TempSrc: Oral Oral Oral Oral  SpO2:    98%  Weight:      Height:       General: alert and cooperative Lochia: appropriate Uterine Fundus: firm Incision: N/A DVT Evaluation: No evidence of DVT seen on physical exam. Labs: Lab Results  Component Value Date   WBC 7.0 06/15/2017   HGB 9.3 (L) 06/15/2017   HCT 30.0 (L) 06/15/2017   MCV 70.3 (L)  06/15/2017   PLT 231 06/15/2017   CMP Latest Ref Rng & Units 06/26/2015  Glucose 65 - 99 mg/dL 155(H)  BUN 6 - 23 mg/dL -  Creatinine 0.50 - 1.10 mg/dL -  Sodium 135 - 145 mEq/L -  Potassium 3.5 - 5.3 mEq/L -  Chloride 96 - 112 mEq/L -  CO2 19 - 32 mEq/L -  Calcium 8.4 - 10.5 mg/dL -  Total Protein 6.0 - 8.3 g/dL -  Total Bilirubin 0.2 - 1.2 mg/dL -  Alkaline Phos 39 - 117 U/L -  AST 0 - 37 U/L -  ALT 0 - 35 U/L -    Discharge instruction: per After Visit Summary and "Baby and Me Booklet".  After visit meds:  Allergies as of 06/17/2017   No Known Allergies  Medication List    STOP taking these medications   ACCU-CHEK FASTCLIX LANCETS Misc   ACCU-CHEK GUIDE w/Device Kit   glucose blood test strip Commonly known as:  ACCU-CHEK GUIDE     TAKE these medications   ibuprofen 600 MG tablet Commonly known as:  ADVIL,MOTRIN Take 1 tablet (600 mg total) by mouth every 6 (six) hours as needed.   multivitamin-prenatal 27-0.8 MG Tabs tablet Take 1 tablet by mouth daily at 12 noon.       Diet: routine diet  Activity: Advance as tolerated. Pelvic rest for 6 weeks.   Outpatient follow up:6 weeks; needs a glucola at [facility] visit Follow up Appt:Future Appointments Date Time Provider [name]  07/23/2017 8:40 AM Julianne Handler, CNM [facility] [name]   Follow up Visit:No Follow-up on file.  Postpartum contraception: Nexplanon  Newborn Data: Live born female  Birth Weight: 8 lb 1.5 oz (3670 g) APGAR: 9, 9  Baby Feeding: Breast Disposition:home with mother   06/17/2017 Serita Grammes, CNM 8:12 AM

## 2017-06-17 NOTE — Lactation Note (Signed)
This note was copied from a baby's chart. Lactation Consultation Note: Mother reports that infant is hungry all the time. She is supplementing infant after she breastfeed. Mother has 2-3 finger span between her breast. When assist with hand expression no observed colostrum present.  She reports that her breast became larger during pregnancy. Mother reports that she supplemented and breastfed last infant up to 7 months. Mother was offered a hand pump and declined. She reports that hand pumps hurt . She has tried them many times. Mother reports that she will get an electric pump from her Insurance company.   PatienBellSoutht Name: [name] UXLKG'MToday's Date: 06/17/2017     Maternal Data    Feeding    LATCH Score/Interventions                      Lactation Tools Discussed/Used     Consult Status      Stevan BornKendrick, [name] 06/17/2017, 10:15 AM

## 2017-06-17 NOTE — Discharge Instructions (Signed)

## 2017-06-24 ENCOUNTER — Other Ambulatory Visit: Payer: Self-pay | Admitting: Family Medicine

## 2017-06-24 MED ORDER — AMLODIPINE BESYLATE 10 MG PO TABS: 10.0000 mg | ORAL_TABLET | Freq: Every day | ORAL | 1 refills | Status: DC

## 2017-06-24 NOTE — Progress Notes (Signed)
Call from Home health nurse - BP elevated. Mild headache. No other symptoms. 160/110, 152/89 on repeat. Amlodipine prescribed. Nurse to check BP in 1 week.

## 2017-06-25 ENCOUNTER — Inpatient Hospital Stay (HOSPITAL_COMMUNITY)
Admission: AD | Admit: 2017-06-25 | Discharge: 2017-06-25 | Disposition: A | Discharge status: Home / Self Care | Payer: BLUE CROSS/BLUE SHIELD | Source: Ambulatory Visit | Attending: Family Medicine | Admitting: Family Medicine

## 2017-06-25 ENCOUNTER — Ambulatory Visit: Payer: Medicaid Other

## 2017-06-25 ENCOUNTER — Encounter (HOSPITAL_COMMUNITY): Payer: Self-pay

## 2017-06-25 ENCOUNTER — Telehealth: Payer: Self-pay | Admitting: General Practice

## 2017-06-25 DIAGNOSIS — R51 Headache: Secondary | ICD-10-CM | POA: Diagnosis not present

## 2017-06-25 DIAGNOSIS — O165 Unspecified maternal hypertension, complicating the puerperium: Secondary | ICD-10-CM | POA: Insufficient documentation

## 2017-06-25 DIAGNOSIS — R519 Headache, unspecified: Secondary | ICD-10-CM

## 2017-06-25 DIAGNOSIS — R030 Elevated blood-pressure reading, without diagnosis of hypertension: Secondary | ICD-10-CM | POA: Diagnosis present

## 2017-06-25 HISTORY — DX: Essential (primary) hypertension: I10

## 2017-06-25 LAB — COMPREHENSIVE METABOLIC PANEL
ALK PHOS: 97 U/L (ref 38–126)
ALT: 52 U/L (ref 14–54)
ANION GAP: 7 (ref 5–15)
AST: 27 U/L (ref 15–41)
Albumin: 2.9 g/dL — ABNORMAL LOW (ref 3.5–5.0)
BILIRUBIN TOTAL: 0.4 mg/dL (ref 0.3–1.2)
BUN: 9 mg/dL (ref 6–20)
CALCIUM: 8.4 mg/dL — AB (ref 8.9–10.3)
CO2: 25 mmol/L (ref 22–32)
CREATININE: 0.68 mg/dL (ref 0.44–1.00)
Chloride: 106 mmol/L (ref 101–111)
Glucose, Bld: 141 mg/dL — ABNORMAL HIGH (ref 65–99)
Potassium: 3.7 mmol/L (ref 3.5–5.1)
SODIUM: 138 mmol/L (ref 135–145)
TOTAL PROTEIN: 6.7 g/dL (ref 6.5–8.1)

## 2017-06-25 LAB — PROTEIN / CREATININE RATIO, URINE
CREATININE, URINE: 81 mg/dL
PROTEIN CREATININE RATIO: 0.09 mg/mg{creat} (ref 0.00–0.15)
TOTAL PROTEIN, URINE: 7 mg/dL

## 2017-06-25 LAB — CBC
HCT: 29.8 % — ABNORMAL LOW (ref 36.0–46.0)
HEMOGLOBIN: 9.1 g/dL — AB (ref 12.0–15.0)
MCH: 22 pg — AB (ref 26.0–34.0)
MCHC: 30.5 g/dL (ref 30.0–36.0)
MCV: 72 fL — AB (ref 78.0–100.0)
Platelets: 260 10*3/uL (ref 150–400)
RBC: 4.14 MIL/uL (ref 3.87–5.11)
RDW: 18.2 % — ABNORMAL HIGH (ref 11.5–15.5)
WBC: 5.3 10*3/uL (ref 4.0–10.5)

## 2017-06-25 MED ORDER — BUTALBITAL-APAP-CAFFEINE 50-325-40 MG PO TABS
2.0000 | ORAL_TABLET | Freq: Once | ORAL | Status: AC
  Administered 2017-06-25: 2 via ORAL
  Filled 2017-06-25: qty 2

## 2017-06-25 NOTE — MAU Provider Note (Signed)
History     CSN: [id number]  Arrival date and time: 06/25/17 1331   First Provider Initiated Contact with Patient 06/25/17 1344     Chief Complaint  Patient presents with  . Hypertension  . Headache   HPI Carly Cabrera is a 31 y.o. [id number] postpartum from a vaginal delivery on 7/8 who presents with elevated blood pressure and a headache. She was prescribed Norvasc 10mg  yesterday and was seen by home health today and had blood pressures in the 150-160s/100s. She caleld the clinic and was told to come in. She reports a HA that she rates a 7/10 and has not tried anything for the pain. She also reports blurred vision and reports an increase in swelling in her lower legs. Denies epigastric pain.   OB History    Gravida Para Term Preterm AB Living   5 5 5  0 0 5   SAB TAB Ectopic Multiple Live Births   0 0 0 0 5      Past Medical History:  Diagnosis Date  . Gestational diabetes   . Gestational diabetes mellitus in pregnancy   . Hypertension     Past Surgical History:  Procedure Laterality Date  . NO PAST SURGERIES      Family History  Problem Relation Age of Onset  . Hypertension Father   . Diabetes Father     Social History  Substance Use Topics  . Smoking status: Never Smoker  . Smokeless tobacco: Never Used  . Alcohol use No    Allergies: No Known Allergies  Prescriptions Prior to Admission  Medication Sig Dispense Refill Last Dose  . amLODipine (NORVASC) 10 MG tablet Take 1 tablet (10 mg total) by mouth daily. 30 tablet 1 06/24/2017 at Unknown time  . ibuprofen (ADVIL,MOTRIN) 600 MG tablet Take 1 tablet (600 mg total) by mouth every 6 (six) hours as needed. 30 tablet 0 Past Week at Unknown time  . Prenatal Vit-Fe Fumarate-FA (MULTIVITAMIN-PRENATAL) 27-0.8 MG TABS tablet Take 1 tablet by mouth daily at 12 noon.   06/24/2017 at Unknown time    Review of Systems  Constitutional: Negative.  Negative for chills and fever.  HENT: Negative.   Eyes: Positive for  visual disturbance.  Respiratory: Negative.  Negative for shortness of breath.   Cardiovascular: Negative.  Negative for chest pain.  Gastrointestinal: Negative for abdominal pain, constipation, diarrhea, nausea and vomiting.  Genitourinary: Positive for vaginal bleeding. Negative for dysuria and vaginal discharge.  Neurological: Positive for headaches. Negative for dizziness.  Psychiatric/Behavioral: Negative.    Physical Exam   Blood pressure 123/89, pulse 82, temperature 98.1 F (36.7 C), temperature source Oral, resp. rate 16, SpO2 100 %, unknown if currently breastfeeding.  Patient Vitals for the past 24 hrs:  BP Temp Temp src Pulse Resp SpO2  06/25/17 1415 123/89 - - 82 - -  06/25/17 1404 (!) 135/96 - - 91 - -  06/25/17 1357 - 98.1 F (36.7 C) Oral - - 100 %  06/25/17 1344 (!) 156/98 - - 85 16 -   Physical Exam  Nursing note and vitals reviewed. Constitutional: She is oriented to person, place, and time. She appears well-developed and well-nourished.  HENT:  Head: Normocephalic and atraumatic.  Eyes: Conjunctivae are normal. No scleral icterus.  Cardiovascular: Normal rate, regular rhythm and normal heart sounds.   Respiratory: Effort normal and breath sounds normal. No respiratory distress.  GI: Soft. She exhibits no distension. There is no tenderness. There is no guarding.  Neurological: She  is alert and oriented to person, place, and time. She has normal strength and normal reflexes. She displays normal reflexes. No cranial nerve deficit or sensory deficit.  Skin: Skin is warm and dry.  Psychiatric: She has a normal mood and affect. Her behavior is normal. Judgment and thought content normal.    MAU Course  Procedures Results for orders placed or performed during the hospital encounter of 06/25/17 (from the past 24 hour(s))  Protein / creatinine ratio, urine     Status: None   Collection Time: 06/25/17  1:48 PM  Result Value Ref Range   Creatinine, Urine 81.00 mg/dL    Total Protein, Urine 7 mg/dL   Protein Creatinine Ratio 0.09 0.00 - 0.15 mg/mg[Cre]  CBC     Status: Abnormal   Collection Time: 06/25/17  1:49 PM  Result Value Ref Range   WBC 5.3 4.0 - 10.5 K/uL   RBC 4.14 3.87 - 5.11 MIL/uL   Hemoglobin 9.1 (L) 12.0 - 15.0 g/dL   HCT 16.1 (L) 09.6 - 04.5 %   MCV 72.0 (L) 78.0 - 100.0 fL   MCH 22.0 (L) 26.0 - 34.0 pg   MCHC 30.5 30.0 - 36.0 g/dL   RDW 40.9 (H) 81.1 - 91.4 %   Platelets 260 150 - 400 K/uL  Comprehensive metabolic panel     Status: Abnormal   Collection Time: 06/25/17  1:49 PM  Result Value Ref Range   Sodium 138 135 - 145 mmol/L   Potassium 3.7 3.5 - 5.1 mmol/L   Chloride 106 101 - 111 mmol/L   CO2 25 22 - 32 mmol/L   Glucose, Bld 141 (H) 65 - 99 mg/dL   BUN 9 6 - 20 mg/dL   Creatinine, Ser 7.82 0.44 - 1.00 mg/dL   Calcium 8.4 (L) 8.9 - 10.3 mg/dL   Total Protein 6.7 6.5 - 8.1 g/dL   Albumin 2.9 (L) 3.5 - 5.0 g/dL   AST 27 15 - 41 U/L   ALT 52 14 - 54 U/L   Alkaline Phosphatase 97 38 - 126 U/L   Total Bilirubin 0.4 0.3 - 1.2 mg/dL   GFR calc non Af Amer >60 >60 mL/min   GFR calc Af Amer >60 >60 mL/min   Anion gap 7 5 - 15    MDM CBC, CMP, Protein/creat ratio Cycle BPs Fioricet PO- patient reports relief from HA, rates 3/10 Assessment and Plan   1. Postpartum hypertension   2. Acute nonintractable headache, unspecified headache type    -Discharge patient home in stable condition -Continue Norvasc for BP and plan nurse BP check on Monday in [facility] clinic -Ibuprofen prn for pain at home -Encouraged to return here or to other Urgent Care/ED if she develops worsening of symptoms, increase in pain, fever, or other concerning symptoms.   Cleone Slim SNM 06/25/2017, 2:33 PM   The patient was seen and examined by me also Agree with note Labs reviewed with VS Ready for discharge Will have her followup in clinic as scheduled  I confirm that I have verified the information documented in the SNM's note and that I have  also personally reperformed the physical exam and all medical decision making activities.    Aviva Signs, CNM

## 2017-06-25 NOTE — Discharge Instructions (Signed)
Home Care Instructions for Mom °ACTIVITY °· Gradually return to your regular activities. °· Let yourself rest. Nap while your baby sleeps. °· Avoid lifting anything that is heavier than 10 lb (4.5 kg) until your health care provider says it is okay. °· Avoid activities that take a lot of effort and energy (are strenuous) until approved by your health care provider. Walking at a slow-to-moderate pace is usually safe. °· If you had a cesarean delivery: °? Do not vacuum, climb stairs, or drive a car for 4-6 weeks. °? Have someone help you at home until you feel like you can do your usual activities yourself. °? Do exercises as told by your health care provider, if this applies. ° °VAGINAL BLEEDING °You may continue to bleed for 4-6 weeks after delivery. Over time, the amount of blood usually decreases and the color of the blood usually gets lighter. However, the flow of bright red blood may increase if you have been too active. If you need to use more than one pad in an hour because your pad gets soaked, or if you pass a large clot: °· Lie down. °· Raise your feet. °· Place a cold compress on your lower abdomen. °· Rest. °· Call your health care provider. ° °If you are breastfeeding, your period should return anytime between 8 weeks after delivery and the time that you stop breastfeeding. If you are not breastfeeding, your period should return 6-8 weeks after delivery. °PERINEAL CARE °The perineal area, or perineum, is the part of your body between your thighs. After delivery, this area needs special care. Follow these instructions as told by your health care provider. °· Take warm tub baths for 15-20 minutes. °· Use medicated pads and pain-relieving sprays and creams as told. °· Do not use tampons or douches until vaginal bleeding has stopped. °· Each time you go to the bathroom: °? Use a peri bottle. °? Change your pad. °? Use towelettes in place of toilet paper until your stitches have healed. °· Do Kegel exercises  every day. Kegel exercises help to maintain the muscles that support the vagina, bladder, and bowels. You can do these exercises while you are standing, sitting, or lying down. To do Kegel exercises: °? Tighten the muscles of your abdomen and the muscles that surround your birth canal. °? Hold for a few seconds. °? Relax. °? Repeat until you have done this 5 times in a row. °· To prevent hemorrhoids from developing or getting worse: °? Drink enough fluid to keep your urine clear or pale yellow. °? Avoid straining when having a bowel movement. °? Take over-the-counter medicines and stool softeners as told by your health care provider. ° °BREAST CARE °· Wear a tight-fitting bra. °· Avoid taking over-the-counter pain medicine for breast discomfort. °· Apply ice to the breasts to help with discomfort as needed: °? Put ice in a plastic bag. °? Place a towel between your skin and the bag. °? Leave the ice on for 20 minutes or as told by your health care provider. ° °NUTRITION °· Eat a well-balanced diet. °· Do not try to lose weight quickly by cutting back on calories. °· Take your prenatal vitamins until your postpartum checkup or until your health care provider tells you to stop. ° °POSTPARTUM DEPRESSION °You may find yourself crying for no apparent reason and unable to cope with all of the changes that come with having a newborn. This mood is called postpartum depression. Postpartum depression happens because your hormone   levels change after delivery. If you have postpartum depression, get support from your partner, friends, and family. If the depression does not go away on its own after several weeks, contact your health care provider. BREAST SELF-EXAM Do a breast self-exam each month, at the same time of the month. If you are breastfeeding, check your breasts just after a feeding, when your breasts are less full. If you are breastfeeding and your period has started, check your breasts on day 5, 6, or 7 of your  period. Report any lumps, bumps, or discharge to your health care provider. Know that breasts are normally lumpy if you are breastfeeding. This is temporary, and it is not a health risk. INTIMACY AND SEXUALITY Avoid sexual activity for at least 3-4 weeks after delivery or until the brownish-red vaginal flow is completely gone. If you want to avoid pregnancy, use some form of birth control. You can get pregnant after delivery, even if you have not had your period. SEEK MEDICAL CARE IF:  You feel unable to cope with the changes that a child brings to your life, and these feelings do not go away after several weeks.  You notice a lump, a bump, or discharge on your breast.  SEEK IMMEDIATE MEDICAL CARE IF:  Blood soaks your pad in 1 hour or less.  You have: ? Severe pain or cramping in your lower abdomen. ? A bad-smelling vaginal discharge. ? A fever that is not controlled by medicine. ? A fever, and an area of your breast is red and sore. ? Pain or redness in your calf. ? Sudden, severe chest pain. ? Shortness of breath. ? Painful or bloody urination. ? Problems with your vision.  You vomit for 12 hours or longer.  You develop a severe headache.  You have serious thoughts about hurting yourself, your child, or anyone else.  This information is not intended to replace advice given to you by your health care provider. Make sure you discuss any questions you have with your health care provider. Document Released: 11/22/2000 Document Revised: 05/02/2016 Document Reviewed: 05/29/2015 Elsevier Interactive Patient Education  2017 Elsevier Inc. General Headache A headache is pain or discomfort felt around the head or neck area. There are many causes and types of headaches. In some cases, the cause may not be found. Follow these instructions at home: Managing pain  Take over-the-counter and prescription medicines only as told by your doctor.  Lie down in a dark, quiet room when you have  a headache.  If directed, apply ice to the head and neck area: ? Put ice in a plastic bag. ? Place a towel between your skin and the bag. ? Leave the ice on for 20 minutes, 2-3 times per day.  Use a heating pad or hot shower to apply heat to the head and neck area as told by your doctor.  Keep lights dim if bright lights bother you or make your headaches worse. Eating and drinking  Eat meals on a regular schedule.  Lessen how much alcohol you drink.  Lessen how much caffeine you drink, or stop drinking caffeine. General instructions  Keep all follow-up visits as told by your doctor. This is important.  Keep a journal to find out if certain things bring on headaches. For example, write down: ? What you eat and drink. ? How much sleep you get. ? Any change to your diet or medicines.  Relax by getting a massage or doing other relaxing activities.  Lessen stress.  Sit up straight. Do not tighten (tense) your muscles.  Do not use tobacco products. This includes cigarettes, chewing tobacco, or e-cigarettes. If you need help quitting, ask your doctor.  Exercise regularly as told by your doctor.  Get enough sleep. This often means 7-9 hours of sleep. Contact a doctor if:  Your symptoms are not helped by medicine.  You have a headache that feels different than the other headaches.  You feel sick to your stomach (nauseous) or you throw up (vomit).  You have a fever. Get help right away if:  Your headache becomes really bad.  You keep throwing up.  You have a stiff neck.  You have trouble seeing.  You have trouble speaking.  You have pain in the eye or ear.  Your muscles are weak or you lose muscle control.  You lose your balance or have trouble walking.  You feel like you will pass out (faint) or you pass out.  You have confusion. This information is not intended to replace advice given to you by your health care provider. Make sure you discuss any questions  you have with your health care provider. Document Released: 09/03/2008 Document Revised: 05/02/2016 Document Reviewed: 03/20/2015 Elsevier Interactive Patient Education  [employer]2018 Elsevier Inc.

## 2017-06-25 NOTE — MAU Note (Signed)
Pt sent to [facility]

## 2017-06-25 NOTE — Telephone Encounter (Signed)
Shelly called from [facility] as a postpartum nurse stating she visited the patient at her home and her blood pressure was elevated to 160/100. Called patient and asked how she was feeling today. Patient states she is feeling worse. Patient would rate headache 6-7/10 with dizziness. States she has taking her norvasc today and yesterday. Per Donette LarryMelanie Bhambri, patient should go to MAU. Informed patient. Patient verbalized understanding and states she will go once her baby's pediatrician visit is over. Patient had no questions. Called & notified MAU

## 2017-06-30 ENCOUNTER — Ambulatory Visit: Payer: Medicaid Other

## 2017-06-30 VITALS — BP 114/73 | HR 72

## 2017-06-30 DIAGNOSIS — Z0130 Encounter for examination of blood pressure without abnormal findings: Secondary | ICD-10-CM

## 2017-06-30 NOTE — Progress Notes (Signed)
Patient presented to the office today for blood pressure check. B/P is 114/73 at this time. Patient reports taking her amlodipine at this time. Patient has been advised of to follow up at her postpartum appointment. Patient voice understanding at this time.

## 2017-07-02 ENCOUNTER — Telehealth: Payer: Self-pay

## 2017-07-02 NOTE — Telephone Encounter (Signed)
[facility] from [facility] nurse stated that she was calling to report a rpt BP that she requested to retrieve in a week from her last visit. She reported BP to be 120/80 today and pt is continuing to take Norvasc which is effective. Shelly also reported that pt is c/o dizziness and headaches.  She also reported that the pt was tearful but did not know the reason why.   I contacted pt and asked pt if she is continuing to have dizziness and headaches.  Pt stated "yes, I have headache and sometimes I have dizziness".  I asked pt if she took anything for the headache she stated that she takes Ibuprofen which is effective.  I advised pt to increase her water intake because it could help with her sx especially since she is breastfeeding.  I also asked pt about her being tearful with the nurse yesterday.  Pt stated "she just felt sad at that moment that was it."  I verified with the pt if this feeling she has a lot pt stated "no, just at that moment."  I asked pt the pp depression screening questions- resulted negative.  I advised to please give the office a call if she starts to feel that way more frequently because we have someone that can help.  Pt stated understanding with no further questions.

## 2017-07-23 ENCOUNTER — Ambulatory Visit (INDEPENDENT_AMBULATORY_CARE_PROVIDER_SITE_OTHER): Payer: BLUE CROSS/BLUE SHIELD | Admitting: Certified Nurse Midwife

## 2017-07-23 ENCOUNTER — Encounter: Payer: Self-pay | Admitting: Certified Nurse Midwife

## 2017-07-23 VITALS — BP 115/66 | HR 75 | Ht 68.0 in | Wt 215.2 lb

## 2017-07-23 DIAGNOSIS — Z8632 Personal history of gestational diabetes: Secondary | ICD-10-CM

## 2017-07-23 DIAGNOSIS — Z8679 Personal history of other diseases of the circulatory system: Secondary | ICD-10-CM

## 2017-07-23 DIAGNOSIS — Z8759 Personal history of other complications of pregnancy, childbirth and the puerperium: Secondary | ICD-10-CM

## 2017-07-23 DIAGNOSIS — Z3202 Encounter for pregnancy test, result negative: Secondary | ICD-10-CM | POA: Diagnosis not present

## 2017-07-23 NOTE — Progress Notes (Signed)
Subjective:     Carly Cabrera is a 31 y.o. female who presents for a postpartum visit. She is 5 weeks postpartum following a spontaneous vaginal delivery. I have fully reviewed the prenatal and intrapartum course. The delivery was at 40 gestational weeks. Outcome: spontaneous vaginal delivery. Anesthesia: local. Postpartum course has been complicated by postpartum HTN, was taking Norvasc, stopped 2 weeks ago. Denies perineal pain or discomfort. Baby's course has been uncomplicated. Baby is feeding by breast. Bleeding no bleeding. Bowel function is normal. Bladder function is normal. Patient is not sexually active. Contraception method is Nexplanon. Postpartum depression screening: negative.  The following portions of the patient's history were reviewed and updated as appropriate: allergies, current medications, past family history, past medical history, past social history, past surgical history and problem list.  Review of Systems Pertinent items are noted in HPI.   Objective:    BP 115/66   Pulse 75   Ht 5\' 8"  (1.727 m)   Wt 215 lb 3.2 oz (97.6 kg)   Breastfeeding? Yes   BMI 32.72 kg/m   General:  alert, cooperative and no distress   Breasts:    Lungs: regular rate and effort  Heart:  reg rate  Abdomen:    Vulva:   Vagina:   Cervix:    Corpus:   Adnexa:    Rectal Exam:        Neuro: grossly intact Assessment:      Normal postpartum exam. Pap smear not done at today's visit (due in December).  Postpartum HTN- resolved A2GDM, delivered- GTT today Contraceptive counseling- planning Nexplanon- none available today, return in 1 week for placement, abstain from IC Plan:      1. Contraception: Planning Nexplanon 2. Follow up in: 1 week

## 2017-07-24 LAB — GLUCOSE TOLERANCE, 2 HOURS
GLUCOSE FASTING GTT: 102 mg/dL — AB (ref 65–99)
Glucose, 2 hour: 217 mg/dL — ABNORMAL HIGH (ref 65–139)

## 2017-07-30 ENCOUNTER — Telehealth: Payer: Self-pay | Admitting: General Practice

## 2017-07-30 NOTE — Telephone Encounter (Signed)
-----   Message from Donette Larry, [place] sent at 07/25/2017  8:19 PM EDT ----- Regarding: GTT Did not pass GTT, likely has DM, needs to follow up with PCP for management. If does not have PCP please refer. Thanks.

## 2017-07-30 NOTE — Telephone Encounter (Signed)
Called patient and informed her of results & asked if she had a pcp. Patient verbalized understanding & states she is [facility]. Told patient she will need to make an appt to follow up with them. Patient verbalized understanding and had no questions

## 2017-07-31 ENCOUNTER — Ambulatory Visit (INDEPENDENT_AMBULATORY_CARE_PROVIDER_SITE_OTHER): Payer: BLUE CROSS/BLUE SHIELD | Admitting: Obstetrics and Gynecology

## 2017-07-31 ENCOUNTER — Encounter: Payer: Self-pay | Admitting: Obstetrics and Gynecology

## 2017-07-31 VITALS — BP 112/74 | HR 87 | Ht 68.0 in | Wt 214.9 lb

## 2017-07-31 DIAGNOSIS — Z30017 Encounter for initial prescription of implantable subdermal contraceptive: Secondary | ICD-10-CM

## 2017-07-31 DIAGNOSIS — Z3049 Encounter for surveillance of other contraceptives: Secondary | ICD-10-CM | POA: Diagnosis not present

## 2017-07-31 DIAGNOSIS — Z3202 Encounter for pregnancy test, result negative: Secondary | ICD-10-CM | POA: Diagnosis not present

## 2017-07-31 LAB — POCT PREGNANCY, URINE: Preg Test, Ur: NEGATIVE

## 2017-07-31 MED ORDER — ETONOGESTREL 68 MG ~~LOC~~ IMPL
68.0000 mg | DRUG_IMPLANT | Freq: Once | SUBCUTANEOUS | Status: AC
Start: 2017-07-31 — End: 2017-07-31
  Administered 2017-07-31: 68 mg via SUBCUTANEOUS

## 2017-07-31 NOTE — Progress Notes (Signed)
     GYNECOLOGY OFFICE PROCEDURE NOTE  Carly Cabrera is a 31 y.o. [id number] here for Nexplanon insertion. Pap smear due in December.  No other gynecologic concerns. Negative pregnancy test today, patient is not currently sexually active.   Nexplanon Insertion Procedure Patient identified, informed consent performed, consent signed.   Patient does understand that irregular bleeding is a very common side effect of this medication. She was advised to have backup contraception for one week after placement. Pregnancy test in clinic today was negative.  Appropriate time out taken.  Patient's left arm was prepped and draped in the usual sterile fashion. The ruler used to measure and mark insertion area.  Patient was prepped with alcohol swab and then injected with 3 ml of 1% lidocaine.  She was prepped with betadine, Nexplanon removed from packaging,  Device confirmed in needle, then inserted full length of needle and withdrawn per handbook instructions. Nexplanon was able to palpated in the patient's arm; patient palpated the insert herself. There was minimal blood loss.  Patient insertion site covered with guaze and a pressure bandage to reduce any bruising.  The patient tolerated the procedure well and was given post procedure instructions.       Rasch, Harolyn Rutherford, NP [facility]

## 2017-10-06 ENCOUNTER — Emergency Department (HOSPITAL_BASED_OUTPATIENT_CLINIC_OR_DEPARTMENT_OTHER): Payer: BLUE CROSS/BLUE SHIELD

## 2017-10-06 ENCOUNTER — Encounter (HOSPITAL_BASED_OUTPATIENT_CLINIC_OR_DEPARTMENT_OTHER): Payer: Self-pay | Admitting: Emergency Medicine

## 2017-10-06 ENCOUNTER — Emergency Department (HOSPITAL_BASED_OUTPATIENT_CLINIC_OR_DEPARTMENT_OTHER)
Admission: EM | Admit: 2017-10-06 | Discharge: 2017-10-06 | Disposition: A | Discharge status: Home / Self Care | Payer: BLUE CROSS/BLUE SHIELD | Attending: Emergency Medicine | Admitting: Emergency Medicine

## 2017-10-06 DIAGNOSIS — R079 Chest pain, unspecified: Secondary | ICD-10-CM | POA: Diagnosis not present

## 2017-10-06 DIAGNOSIS — I10 Essential (primary) hypertension: Secondary | ICD-10-CM | POA: Diagnosis not present

## 2017-10-06 DIAGNOSIS — R1013 Epigastric pain: Secondary | ICD-10-CM | POA: Diagnosis present

## 2017-10-06 DIAGNOSIS — Z7984 Long term (current) use of oral hypoglycemic drugs: Secondary | ICD-10-CM | POA: Diagnosis not present

## 2017-10-06 LAB — COMPREHENSIVE METABOLIC PANEL
ALBUMIN: 4 g/dL (ref 3.5–5.0)
ALT: 12 U/L — AB (ref 14–54)
AST: 15 U/L (ref 15–41)
Alkaline Phosphatase: 74 U/L (ref 38–126)
Anion gap: 7 (ref 5–15)
BUN: 11 mg/dL (ref 6–20)
CO2: 25 mmol/L (ref 22–32)
CREATININE: 0.65 mg/dL (ref 0.44–1.00)
Calcium: 8.9 mg/dL (ref 8.9–10.3)
Chloride: 105 mmol/L (ref 101–111)
GFR calc Af Amer: >60 mL/min (ref >60)
GLUCOSE: 94 mg/dL (ref 65–99)
POTASSIUM: 4 mmol/L (ref 3.5–5.1)
Sodium: 137 mmol/L (ref 135–145)
TOTAL PROTEIN: 7.8 g/dL (ref 6.5–8.1)
Total Bilirubin: 0.4 mg/dL (ref 0.3–1.2)

## 2017-10-06 LAB — CBC WITH DIFFERENTIAL/PLATELET
BASOS ABS: 0 10*3/uL (ref 0.0–0.1)
BASOS PCT: 1 %
Eosinophils Absolute: 0.1 10*3/uL (ref 0.0–0.7)
Eosinophils Relative: 2 %
HEMATOCRIT: 35.1 % — AB (ref 36.0–46.0)
Hemoglobin: 11.3 g/dL — ABNORMAL LOW (ref 12.0–15.0)
LYMPHS PCT: 38 %
Lymphs Abs: 2.1 10*3/uL (ref 0.7–4.0)
MCH: 24.7 pg — ABNORMAL LOW (ref 26.0–34.0)
MCHC: 32.2 g/dL (ref 30.0–36.0)
MCV: 76.6 fL — AB (ref 78.0–100.0)
Monocytes Absolute: 0.5 10*3/uL (ref 0.1–1.0)
Monocytes Relative: 9 %
NEUTROS ABS: 2.9 10*3/uL (ref 1.7–7.7)
NEUTROS PCT: 52 %
Platelets: 295 10*3/uL (ref 150–400)
RBC: 4.58 MIL/uL (ref 3.87–5.11)
RDW: 18.6 % — AB (ref 11.5–15.5)
WBC: 5.5 10*3/uL (ref 4.0–10.5)

## 2017-10-06 LAB — TROPONIN I: Troponin I: <0.03 ng/mL (ref <0.03)

## 2017-10-06 LAB — LIPASE, BLOOD: Lipase: 32 U/L (ref 11–51)

## 2017-10-06 MED ORDER — ASPIRIN 81 MG PO CHEW
324.0000 mg | CHEWABLE_TABLET | Freq: Once | ORAL | Status: AC
  Administered 2017-10-06: 324 mg via ORAL
  Filled 2017-10-06: qty 4

## 2017-10-06 MED ORDER — NITROGLYCERIN 0.4 MG SL SUBL: 0.4000 mg | SUBLINGUAL_TABLET | SUBLINGUAL | Status: DC | PRN

## 2017-10-06 NOTE — ED Provider Notes (Addendum)
[facility] EMERGENCY DEPARTMENT Provider Note   CSN: [id number] Arrival date & time: 10/06/17  1008     History   Chief Complaint Chief Complaint  Patient presents with  . Abdominal Pain    epigastric    HPI Carly Cabrera is a 31 y.o. female.  HPI   Yesterday began to have chest and epigastric pain.  Sharp pain. 10/10 pain. Nothing makes it better or worse, have not tried medicine. When take deep breath it hurts more, hurts more laying down, better sitting up.  No cough or fever, no shortness of breath. No leg pain or swelling. No nausea or vomiting, no diarrhea or constipation. No cough.  Sore throat, a little bit this morning.  Noticed it after waking up yesterday, worse when doing laundry. When sit down is better. No radiation of pain.   No family hx of CAD No hx of smoking, drugs or etoh No hx of ibuprofen use 7/10 pain  Past Medical History:  Diagnosis Date  . Gestational diabetes   . Gestational diabetes mellitus in pregnancy   . Hypertension     Patient Active Problem List   Diagnosis Date Noted  . 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 History of gestational diabetes 07/28/2015    Past Surgical History:  Procedure Laterality Date  . NO PAST SURGERIES      OB History    Gravida Para Term Preterm AB Living   5 5 5  0 0 5   SAB TAB Ectopic Multiple Live Births   0 0 0 0 5       Home Medications    Prior to Admission medications   Medication Sig Start Date End Date Taking? Authorizing Provider  metFORMIN (GLUCOPHAGE) 500 MG tablet Take by mouth 2 (two) times daily with a meal.   Yes [provider]  amLODipine (NORVASC) 10 MG tablet Take 1 tablet (10 mg total) by mouth daily. Patient not taking: Reported on 07/23/2017 06/24/17   Levie Heritage, DO  ibuprofen (ADVIL,MOTRIN) 600 MG tablet Take 1 tablet (600 mg total) by mouth every 6 (six) hours as needed. 06/17/17   Arabella Merles, CNM  Prenatal Vit-Fe Fumarate-FA  (MULTIVITAMIN-PRENATAL) 27-0.8 MG TABS tablet Take 1 tablet by mouth daily at 12 noon.    [provider]    Family History Family History  Problem Relation Age of Onset  . Hypertension Father   . Diabetes Father     Social History Social History  Substance Use Topics  . Smoking status: Never Smoker  . Smokeless tobacco: Never Used  . Alcohol use No     Allergies   Patient has no known allergies.   Review of Systems Review of Systems  Constitutional: Negative for fatigue and fever.  HENT: Negative for sore throat.   Eyes: Negative for visual disturbance.  Respiratory: Negative for cough and shortness of breath.   Cardiovascular: Positive for chest pain. Negative for palpitations and leg swelling.  Gastrointestinal: Positive for abdominal pain. Negative for constipation, diarrhea, nausea and vomiting.  Genitourinary: Negative for difficulty urinating and dysuria.  Musculoskeletal: Negative for back pain and neck pain.  Skin: Negative for rash.  Neurological: Negative for syncope and headaches.     Physical Exam Updated Vital Signs BP 108/74   Pulse 63   Temp 98.6 F (37 C) (Oral)   Resp 14   SpO2 100%   Physical Exam  Constitutional: She is oriented to person, place, and time. She appears well-developed  and well-nourished. No distress.  HENT:  Head: Normocephalic and atraumatic.  Eyes: Conjunctivae and EOM are normal.  Neck: Normal range of motion.  Cardiovascular: Normal rate, regular rhythm, normal heart sounds and intact distal pulses.  Exam reveals no gallop and no friction rub.   No murmur heard. Pulmonary/Chest: Effort normal and breath sounds normal. No respiratory distress. She has no wheezes. She has no rales. She exhibits tenderness.  Abdominal: Soft. She exhibits no distension. There is no tenderness. There is no guarding.  Musculoskeletal: She exhibits no edema or tenderness.  Neurological: She is alert and oriented to person, place, and  time.  Skin: Skin is warm and dry. No rash noted. She is not diaphoretic. No erythema.  Nursing note and vitals reviewed.    ED Treatments / Results  Labs (all labs ordered are listed, but only abnormal results are displayed) Labs Reviewed  CBC WITH DIFFERENTIAL/PLATELET - Abnormal; Notable for the following:       Result Value   Hemoglobin 11.3 (*)    HCT 35.1 (*)    MCV 76.6 (*)    MCH 24.7 (*)    RDW 18.6 (*)    All other components within normal limits  COMPREHENSIVE METABOLIC PANEL - Abnormal; Notable for the following:    ALT 12 (*)    All other components within normal limits  TROPONIN I  LIPASE, BLOOD  TROPONIN I    EKG  EKG Interpretation  Date/Time:  Monday October 06 2017 13:04:41 EDT Ventricular Rate:  68 PR Interval:    QRS Duration: 93 QT Interval:  410 QTC Calculation: 436 R Axis:   17 Text Interpretation:  Sinus rhythm Nonspecific T abnormalities, diffuse leads No significant change since last tracing earlier today Confirmed by Alvira Monday ([id number]) on 10/06/2017 9:31:50 PM       Radiology Dg Chest 2 View  Result Date: 10/06/2017 CLINICAL DATA:  Chest pain. EXAM: CHEST  2 VIEW COMPARISON:  None available currently. FINDINGS: The heart size and mediastinal contours are within normal limits. Both lungs are clear. No pneumothorax or pleural effusion is noted. The visualized skeletal structures are unremarkable. IMPRESSION: No active cardiopulmonary disease. Electronically Signed   By: Lupita Raider, M.D.   On: 10/06/2017 11:24    Procedures Procedures (including critical care time)  Medications Ordered in ED Medications  aspirin chewable tablet 324 mg (324 mg Oral Given 10/06/17 1238)     Initial Impression / Assessment and Plan / ED Course  I have reviewed the triage vital signs and the nursing notes.  Pertinent labs & imaging results that were available during my care of the patient were reviewed by me and considered in my medical  decision making (see chart for details).     31 year old female with a history of hypertension and gestational diabetes still under treatment 4 mos postpartum presents with concern for epigastric and chest pain beginning yesterday.  Abdominal exam benign low suspicion for cholecystitis, perforation.  Lipase is within normal limits.  EKG shows nonspecific T wave inversions.  While hx concerning for possible pericarditis, there are No signs of pericarditis on EKG.  She has no dyspnea, no hypoxia, no tachypnea, no tachycardia, no asymmetric leg swelling, and have low suspicion for pulmonary embolus.  Normal bilateral upper and lower ext pulses and doubt dissection by hx, physical exam and imaging. Chest x-ray shows no evidence of pneumonia, pneumothorax, dissection, or cardiomegaly.  Initial troponin negative.    While overall have low suspicion for  ACS given pain occurring since yesterday and negative troponin, pain intermittent and recommend a 3-hour troponin for recheck.  Patient and husband report that they have to get their other kids and did not want to stay for second troponin.  Second troponin drawn early.  Discussed with patient and husband recommend staying for results, given possibility of MI and risks of MI including death, disability however they would like to leave and be notified of abnormal results.  Second troponin negative.  Patient with nonspecific TW changes with no prior ECG. HEART score is 3.  She will follow up with PCP. Possible muscular pain. Felt improved prior to leaving ED.   Final Clinical Impressions(s) / ED Diagnoses   Final diagnoses:  Chest pain, unspecified type    New Prescriptions Discharge Medication List as of 10/06/2017  1:35 PM          Alvira MondaySchlossman, Caydn Justen, MD 10/06/17 2151

## 2017-10-06 NOTE — ED Notes (Signed)
ED Provider at bedside. 

## 2017-10-06 NOTE — ED Triage Notes (Signed)
Patient states that she was doing laundry yesterday and started to have pain to her mid epigastric region. The patient denies any SOB or N/V

## 2017-10-06 NOTE — ED Notes (Signed)
Patient transported to X-ray 

## 2017-12-10 DIAGNOSIS — R7303 Prediabetes: Secondary | ICD-10-CM | POA: Insufficient documentation

## 2017-12-10 DIAGNOSIS — E786 Lipoprotein deficiency: Secondary | ICD-10-CM | POA: Insufficient documentation

## 2017-12-10 DIAGNOSIS — E559 Vitamin D deficiency, unspecified: Secondary | ICD-10-CM | POA: Insufficient documentation

## 2018-01-01 ENCOUNTER — Other Ambulatory Visit: Payer: Self-pay

## 2018-01-01 ENCOUNTER — Emergency Department (HOSPITAL_BASED_OUTPATIENT_CLINIC_OR_DEPARTMENT_OTHER): Payer: BLUE CROSS/BLUE SHIELD

## 2018-01-01 ENCOUNTER — Encounter (HOSPITAL_BASED_OUTPATIENT_CLINIC_OR_DEPARTMENT_OTHER): Payer: Self-pay | Admitting: Emergency Medicine

## 2018-01-01 ENCOUNTER — Emergency Department (HOSPITAL_BASED_OUTPATIENT_CLINIC_OR_DEPARTMENT_OTHER)
Admission: EM | Admit: 2018-01-01 | Discharge: 2018-01-01 | Disposition: A | Discharge status: Home / Self Care | Payer: BLUE CROSS/BLUE SHIELD | Attending: Emergency Medicine | Admitting: Emergency Medicine

## 2018-01-01 DIAGNOSIS — K2970 Gastritis, unspecified, without bleeding: Secondary | ICD-10-CM | POA: Diagnosis not present

## 2018-01-01 DIAGNOSIS — I10 Essential (primary) hypertension: Secondary | ICD-10-CM | POA: Diagnosis not present

## 2018-01-01 DIAGNOSIS — R1011 Right upper quadrant pain: Secondary | ICD-10-CM

## 2018-01-01 DIAGNOSIS — E119 Type 2 diabetes mellitus without complications: Secondary | ICD-10-CM | POA: Insufficient documentation

## 2018-01-01 DIAGNOSIS — K2900 Acute gastritis without bleeding: Secondary | ICD-10-CM

## 2018-01-01 DIAGNOSIS — R1013 Epigastric pain: Secondary | ICD-10-CM

## 2018-01-01 DIAGNOSIS — Z79899 Other long term (current) drug therapy: Secondary | ICD-10-CM | POA: Diagnosis not present

## 2018-01-01 DIAGNOSIS — R079 Chest pain, unspecified: Secondary | ICD-10-CM | POA: Diagnosis present

## 2018-01-01 LAB — CBC WITH DIFFERENTIAL/PLATELET
BASOS ABS: 0 10*3/uL (ref 0.0–0.1)
BASOS PCT: 1 %
Eosinophils Absolute: 0.1 10*3/uL (ref 0.0–0.7)
Eosinophils Relative: 2 %
HEMATOCRIT: 34.3 % — AB (ref 36.0–46.0)
Hemoglobin: 11.2 g/dL — ABNORMAL LOW (ref 12.0–15.0)
Lymphocytes Relative: 42 %
Lymphs Abs: 2.3 10*3/uL (ref 0.7–4.0)
MCH: 26.7 pg (ref 26.0–34.0)
MCHC: 32.7 g/dL (ref 30.0–36.0)
MCV: 81.9 fL (ref 78.0–100.0)
MONO ABS: 0.5 10*3/uL (ref 0.1–1.0)
Monocytes Relative: 9 %
NEUTROS ABS: 2.4 10*3/uL (ref 1.7–7.7)
NEUTROS PCT: 46 %
Platelets: 292 10*3/uL (ref 150–400)
RBC: 4.19 MIL/uL (ref 3.87–5.11)
RDW: 14.9 % (ref 11.5–15.5)
WBC: 5.3 10*3/uL (ref 4.0–10.5)

## 2018-01-01 LAB — URINALYSIS, MICROSCOPIC (REFLEX): RBC / HPF: NONE SEEN RBC/hpf (ref 0–5)

## 2018-01-01 LAB — COMPREHENSIVE METABOLIC PANEL
ALBUMIN: 3.6 g/dL (ref 3.5–5.0)
ALT: 11 U/L — ABNORMAL LOW (ref 14–54)
AST: 21 U/L (ref 15–41)
Alkaline Phosphatase: 65 U/L (ref 38–126)
Anion gap: 10 (ref 5–15)
BILIRUBIN TOTAL: 0.4 mg/dL (ref 0.3–1.2)
BUN: 11 mg/dL (ref 6–20)
CHLORIDE: 107 mmol/L (ref 101–111)
CO2: 24 mmol/L (ref 22–32)
Calcium: 9 mg/dL (ref 8.9–10.3)
Creatinine, Ser: 0.66 mg/dL (ref 0.44–1.00)
GFR calc Af Amer: >60 mL/min (ref >60)
GFR calc non Af Amer: >60 mL/min (ref >60)
GLUCOSE: 112 mg/dL — AB (ref 65–99)
POTASSIUM: 3.9 mmol/L (ref 3.5–5.1)
Sodium: 141 mmol/L (ref 135–145)
TOTAL PROTEIN: 7.3 g/dL (ref 6.5–8.1)

## 2018-01-01 LAB — URINALYSIS, ROUTINE W REFLEX MICROSCOPIC
Bilirubin Urine: NEGATIVE
Glucose, UA: NEGATIVE mg/dL
Hgb urine dipstick: NEGATIVE
Ketones, ur: NEGATIVE mg/dL
NITRITE: NEGATIVE
PH: 6 (ref 5.0–8.0)
Protein, ur: NEGATIVE mg/dL

## 2018-01-01 LAB — TROPONIN I

## 2018-01-01 LAB — LIPASE, BLOOD: Lipase: 43 U/L (ref 11–51)

## 2018-01-01 LAB — PREGNANCY, URINE: Preg Test, Ur: NEGATIVE

## 2018-01-01 MED ORDER — SUCRALFATE 1 G PO TABS: 1.0000 g | ORAL_TABLET | Freq: Three times a day (TID) | ORAL | 0 refills | Status: AC

## 2018-01-01 MED ORDER — SODIUM CHLORIDE 0.9 % IV BOLUS (SEPSIS): 1000.0000 mL | Freq: Once | INTRAVENOUS | Status: DC

## 2018-01-01 MED ORDER — FAMOTIDINE IN NACL 20-0.9 MG/50ML-% IV SOLN
20.0000 mg | Freq: Once | INTRAVENOUS | Status: AC
  Administered 2018-01-01: 20 mg via INTRAVENOUS
  Filled 2018-01-01: qty 50

## 2018-01-01 MED ORDER — OMEPRAZOLE 40 MG PO CPDR: 40.0000 mg | DELAYED_RELEASE_CAPSULE | Freq: Every day | ORAL | 0 refills | Status: AC

## 2018-01-01 MED ORDER — GI COCKTAIL ~~LOC~~
30.0000 mL | Freq: Once | ORAL | Status: AC
  Administered 2018-01-01: 30 mL via ORAL
  Filled 2018-01-01: qty 30

## 2018-01-01 MED FILL — SUCRALFATE 1 GM TABLET: 1 | 14 days supply | Qty: 56 | Fill #0

## 2018-01-01 MED FILL — OMEPRAZOLE DR 40 MG CAPSULE: 40 | 14 days supply | Qty: 14 | Fill #0

## 2018-01-01 NOTE — ED Notes (Signed)
PER EDP if patient urinates-no IV fluids needed.  Patient ambulatory to bathroom.

## 2018-01-01 NOTE — ED Provider Notes (Signed)
[facility] EMERGENCY DEPARTMENT Provider Note   CSN: [id number] Arrival date & time: 01/01/18  [id number]     History   Chief Complaint Chief Complaint  Patient presents with  . Chest Pain    HPI Carly Cabrera is a 32 y.o. female.  HPI   32 year old female with history of hypertension, gestational diabetes, here with epigastric pain.  The patient has been seen multiple times for this.  She has had a cardiac evaluation as well and seen a cardiologist in December and was negative.  The patient states that over the last 3 days, she is noticed recurrence of her chest pain.  She describes it as a sharp but also burning sensation in her epigastric and substernal area.  It radiates up towards her throat when she lies flat.  It seems to worsen when she is eating spicy foods.  She has no associated nausea or vomiting.  Denies any shortness of breath or diaphoresis.  No lower extremity swelling.  She is not on blood thinners.  The pain has been constant for the last 3 days.  It does not seem to be associated with exertion.  Past Medical History:  Diagnosis Date  . Gestational diabetes   . Gestational diabetes mellitus in pregnancy   . Hypertension     Patient Active Problem List   Diagnosis Date Noted  . 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 History of gestational diabetes 07/28/2015    Past Surgical History:  Procedure Laterality Date  . NO PAST SURGERIES      OB History    Gravida Para Term Preterm AB Living   5 5 5  0 0 5   SAB TAB Ectopic Multiple Live Births   0 0 0 0 5       Home Medications    Prior to Admission medications   Medication Sig Start Date End Date Taking? Authorizing Provider  amLODipine (NORVASC) 10 MG tablet Take 1 tablet (10 mg total) by mouth daily. Patient not taking: Reported on 07/23/2017 06/24/17   Levie HeritageStinson, Jacob J, DO  ibuprofen (ADVIL,MOTRIN) 600 MG tablet Take 1 tablet (600 mg total) by mouth every 6 (six) hours as needed. 06/17/17   Arabella MerlesShaw,  Kimberly D, CNM  metFORMIN (GLUCOPHAGE) 500 MG tablet Take by mouth 2 (two) times daily with a meal.    [provider]  omeprazole (PRILOSEC) 40 MG capsule Take 1 capsule (40 mg total) by mouth daily for 14 days. First thing in the morning, 20-30 minutes before eating 01/01/18 01/15/18  Shaune PollackIsaacs, Teegan Brandis, MD  Prenatal Vit-Fe Fumarate-FA (MULTIVITAMIN-PRENATAL) 27-0.8 MG TABS tablet Take 1 tablet by mouth daily at 12 noon.    [provider]  sucralfate (CARAFATE) 1 g tablet Take 1 tablet (1 g total) by mouth 4 (four) times daily -  with meals and at bedtime for 14 days. 01/01/18 01/15/18  Shaune PollackIsaacs, Fiore Detjen, MD    Family History Family History  Problem Relation Age of Onset  . Hypertension Father   . Diabetes Father     Social History Social History   Tobacco Use  . Smoking status: Never Smoker  . Smokeless tobacco: Never Used  Substance Use Topics  . Alcohol use: No  . Drug use: No     Allergies   Patient has no known allergies.   Review of Systems Review of Systems  Constitutional: Positive for fatigue.  Cardiovascular: Positive for chest pain.  Gastrointestinal: Positive for abdominal pain and nausea.  All other systems reviewed  and are negative.    Physical Exam Updated Vital Signs BP 118/81 (BP Location: Right Arm)   Pulse 80   Temp 98.2 F (36.8 C) (Oral)   Resp 18   Ht 5\' 8"  (1.727 m)   Wt 90.3 kg (199 lb)   SpO2 100%   Breastfeeding? Yes   BMI 30.26 kg/m   Physical Exam  Constitutional: She is oriented to person, place, and time. She appears well-developed and well-nourished. No distress.  HENT:  Head: Normocephalic and atraumatic.  Eyes: Conjunctivae are normal.  Neck: Neck supple.  Cardiovascular: Normal rate, regular rhythm and normal heart sounds. Exam reveals no friction rub.  No murmur heard. Pulmonary/Chest: Effort normal and breath sounds normal. No respiratory distress. She has no wheezes. She has no rales.  Abdominal: She  exhibits no distension.  Moderate epigastric tenderness that reproduces her reported chest pain.  No overt Murphy's, but mild right upper quadrant tenderness.  No rebound or guarding.  Musculoskeletal: She exhibits no edema.  Neurological: She is alert and oriented to person, place, and time. She exhibits normal muscle tone.  Skin: Skin is warm. Capillary refill takes less than 2 seconds.  Psychiatric: She has a normal mood and affect.  Nursing note and vitals reviewed.    ED Treatments / Results  Labs (all labs ordered are listed, but only abnormal results are displayed) Labs Reviewed  CBC WITH DIFFERENTIAL/PLATELET - Abnormal; Notable for the following components:      Result Value   Hemoglobin 11.2 (*)    HCT 34.3 (*)    All other components within normal limits  COMPREHENSIVE METABOLIC PANEL - Abnormal; Notable for the following components:   Glucose, Bld 112 (*)    ALT 11 (*)    All other components within normal limits  URINALYSIS, ROUTINE W REFLEX MICROSCOPIC - Abnormal; Notable for the following components:   Specific Gravity, Urine <1.005 (*)    Leukocytes, UA SMALL (*)    All other components within normal limits  URINALYSIS, MICROSCOPIC (REFLEX) - Abnormal; Notable for the following components:   Bacteria, UA RARE (*)    Squamous Epithelial / LPF 0-5 (*)    All other components within normal limits  TROPONIN I  LIPASE, BLOOD  PREGNANCY, URINE    EKG  EKG Interpretation  Date/Time:  Thursday January 01 2018 09:24:43 EST Ventricular Rate:  85 PR Interval:    QRS Duration: 92 QT Interval:  360 QTC Calculation: 428 R Axis:   15 Text Interpretation:  Sinus rhythm Nonspecific T abnormalities, diffuse leads No significant change since last tracing Confirmed by Shaune Pollack [phone]) on 01/01/2018 9:27:24 AM Also confirmed by Shaune Pollack [phone]), editor Sheppard Evens ([id number])  on 01/01/2018 9:35:55 AM       Radiology Dg Chest 2 View  Result Date:  01/01/2018 CLINICAL DATA:  Chest pain EXAM: CHEST  2 VIEW COMPARISON:  October 06, 2017 FINDINGS: Lungs are clear. Heart size and pulmonary vascularity are normal. No adenopathy. No pneumothorax. No bone lesions. IMPRESSION: No edema or consolidation. Electronically Signed   By: Bretta Bang III M.D.   On: 01/01/2018 09:35   US Abdomen Limited Ruq  Result Date: 01/01/2018 CLINICAL DATA:  Right upper quadrant pain EXAM: ULTRASOUND ABDOMEN LIMITED RIGHT UPPER QUADRANT COMPARISON:  None. FINDINGS: Gallbladder: Gallbladder is partially contracted. The patient was not NPO for the procedure. No visible stones or wall thickening. Common bile duct: Diameter: Normal caliber, 2 mm Liver: No focal lesion identified. Within normal limits  in parenchymal echogenicity. Portal vein is patent on color Doppler imaging with normal direction of blood flow towards the liver. IMPRESSION: No evidence of cholelithiasis or cholecystitis. No acute findings. Electronically Signed   By: Charlett Nose M.D.   On: 01/01/2018 10:47    Procedures Procedures (including critical care time)  Medications Ordered in ED Medications  gi cocktail (Maalox,Lidocaine,Donnatal) (30 mLs Oral Given 01/01/18 0951)  famotidine (PEPCID) IVPB 20 mg premix (0 mg Intravenous Stopped 01/01/18 1142)     Initial Impression / Assessment and Plan / ED Course  I have reviewed the triage vital signs and the nursing notes.  Pertinent labs & imaging results that were available during my care of the patient were reviewed by me and considered in my medical decision making (see chart for details).     32 year old female here with intermittent reported chest and epigastric pain.  Her history is highly consistent with likely GI etiology.  I suspect she has a strong component of gastritis given her relation to eating at spicy foods as well as lying flat.  She was given a GI cocktail here with significant improvement in her symptoms.  As mentioned, she has  seen cardiology for this in the past.  She was seen in October for this as well.  She has T wave inversions on her EKG but these are unchanged from prior.  Troponin is negative despite constant, unchanged symptoms, for the last 3 days and I do not suspect ACS.  Furthermore, she was cleared by cardiology.  I also do not suspect PE and she is not hypoxic, tachypneic, and is PERC negative.  Given negative ultrasound without signs of gallstones, and reassuring lab work otherwise, I feel is reasonable to treat her as an outpatient with antacids and outpatient follow-up.  Final Clinical Impressions(s) / ED Diagnoses   Final diagnoses:  RUQ pain  Epigastric pain  Acute superficial gastritis without hemorrhage    ED Discharge Orders        Ordered    omeprazole (PRILOSEC) 40 MG capsule  Daily     01/01/18 1148    sucralfate (CARAFATE) 1 g tablet  3 times daily with meals & bedtime     01/01/18 1148       Shaune Pollack, MD 01/01/18 1148

## 2018-01-01 NOTE — ED Triage Notes (Addendum)
Patient reports intermittent chest pain since she was seen in October.  Reports the pain as sharp at times.  Denies nausea, vomiting, shortness of breath. Reports seen at cardiologist in December.  States she called yesterday and notified them she was having chest pain-states she was referred to ER.

## 2019-10-08 ENCOUNTER — Emergency Department (HOSPITAL_BASED_OUTPATIENT_CLINIC_OR_DEPARTMENT_OTHER): Payer: BC Managed Care – PPO

## 2019-10-08 ENCOUNTER — Emergency Department (HOSPITAL_BASED_OUTPATIENT_CLINIC_OR_DEPARTMENT_OTHER)
Admission: EM | Admit: 2019-10-08 | Discharge: 2019-10-08 | Disposition: A | Discharge status: Home / Self Care | Payer: BC Managed Care – PPO | Attending: Emergency Medicine | Admitting: Emergency Medicine

## 2019-10-08 ENCOUNTER — Encounter (HOSPITAL_BASED_OUTPATIENT_CLINIC_OR_DEPARTMENT_OTHER): Payer: Self-pay | Admitting: Adult Health

## 2019-10-08 ENCOUNTER — Other Ambulatory Visit: Payer: Self-pay

## 2019-10-08 DIAGNOSIS — R079 Chest pain, unspecified: Secondary | ICD-10-CM | POA: Diagnosis present

## 2019-10-08 DIAGNOSIS — Z7984 Long term (current) use of oral hypoglycemic drugs: Secondary | ICD-10-CM | POA: Diagnosis not present

## 2019-10-08 DIAGNOSIS — N644 Mastodynia: Secondary | ICD-10-CM | POA: Diagnosis not present

## 2019-10-08 DIAGNOSIS — I10 Essential (primary) hypertension: Secondary | ICD-10-CM | POA: Diagnosis not present

## 2019-10-08 LAB — PREGNANCY, URINE: Preg Test, Ur: NEGATIVE

## 2019-10-08 MED ORDER — METHOCARBAMOL 500 MG PO TABS
500.0000 mg | ORAL_TABLET | Freq: Once | ORAL | Status: AC
  Administered 2019-10-08: 500 mg via ORAL
  Filled 2019-10-08: qty 1

## 2019-10-08 MED ORDER — METHOCARBAMOL 500 MG PO TABS: 500.0000 mg | ORAL_TABLET | Freq: Three times a day (TID) | ORAL | 0 refills | Status: DC | PRN

## 2019-10-08 MED ORDER — KETOROLAC TROMETHAMINE 30 MG/ML IJ SOLN
30.0000 mg | Freq: Once | INTRAMUSCULAR | Status: AC
  Administered 2019-10-08: 04:00:00 30 mg via INTRAMUSCULAR
  Filled 2019-10-08: qty 1

## 2019-10-08 NOTE — ED Provider Notes (Signed)
TIME SEEN: 3:59 AM  CHIEF COMPLAINT: Left breast and axilla pain  HPI: Patient is a 33 year old female with history of gestational diabetes, hypertension who presents to the emergency department with 1 month of discomfort to the left axilla and left breast.  Has been seen by her primary care doctor 2 weeks ago and was told to take ibuprofen which helps with the pain for 1 to 2 hours and then the pain will return.  No skin discoloration.  No fevers.  No nipple discharge.  No shortness of breath, cough, nausea, vomiting, dizziness or diaphoresis.  No family history of premature CAD.  Pain is worse with palpation over this area and movement of the arm.  No injury to the chest or arm.  No numbness or weakness in the left arm.  No history of PE, DVT, exogenous estrogen use, recent fractures, surgery, trauma, hospitalization or prolonged travel. No lower extremity swelling or pain. No calf tenderness.  ROS: See HPI Constitutional: no fever  Eyes: no drainage  ENT: no runny nose   Cardiovascular:  chest pain  Resp: no SOB  GI: no vomiting GU: no dysuria Integumentary: no rash  Allergy: no hives  Musculoskeletal: no leg swelling  Neurological: no slurred speech ROS otherwise negative  PAST MEDICAL HISTORY/PAST SURGICAL HISTORY:  Past Medical History:  Diagnosis Date  . Gestational diabetes   . Gestational diabetes mellitus in pregnancy   . Hypertension     MEDICATIONS:  Prior to Admission medications   Medication Sig Start Date End Date Taking? Authorizing Provider  amLODipine (NORVASC) 10 MG tablet Take 1 tablet (10 mg total) by mouth daily. Patient not taking: Reported on 07/23/2017 06/24/17   Truett Mainland, DO  ibuprofen (ADVIL,MOTRIN) 600 MG tablet Take 1 tablet (600 mg total) by mouth every 6 (six) hours as needed. 06/17/17   Myrtis Ser, CNM  metFORMIN (GLUCOPHAGE) 500 MG tablet Take by mouth 2 (two) times daily with a meal.    [provider]  omeprazole (PRILOSEC) 40  MG capsule Take 1 capsule (40 mg total) by mouth daily for 14 days. First thing in the morning, 20-30 minutes before eating 01/01/18 01/15/18  Duffy Bruce, MD  Prenatal Vit-Fe Fumarate-FA (MULTIVITAMIN-PRENATAL) 27-0.8 MG TABS tablet Take 1 tablet by mouth daily at 12 noon.    [provider]  sucralfate (CARAFATE) 1 g tablet Take 1 tablet (1 g total) by mouth 4 (four) times daily -  with meals and at bedtime for 14 days. 01/01/18 01/15/18  Duffy Bruce, MD    ALLERGIES:  No Known Allergies  SOCIAL HISTORY:  Social History   Tobacco Use  . Smoking status: Never Smoker  . Smokeless tobacco: Never Used  Substance Use Topics  . Alcohol use: No    FAMILY HISTORY: Family History  Problem Relation Age of Onset  . Hypertension Father   . Diabetes Father     EXAM: BP 119/82 (BP Location: Right Arm)   Pulse 71   Temp 98.5 F (36.9 C) (Oral)   Resp 18   Ht 5\' 8"  (1.727 m)   Wt 89.8 kg   SpO2 100%   BMI 30.11 kg/m  CONSTITUTIONAL: Alert and oriented and responds appropriately to questions. Well-appearing; well-nourished HEAD: Normocephalic EYES: Conjunctivae clear, pupils appear equal, EOMI ENT: normal nose; moist mucous membranes NECK: Supple, no meningismus, no nuchal rigidity, no LAD; She is tender to palpation over the left trapezius muscle.  There is no midline spinal tenderness or step-off or  deformity. CARD: RRR; S1 and S2 appreciated; no murmurs, no clicks, no rubs, no gallops CHEST: Patient is tender to palpation over the left chest wall and left axilla and left breast.  There is no breast mass appreciated, redness, warmth, induration or fluctuance.  No axillary lymphadenopathy.  No nipple discharge. RESP: Normal chest excursion without splinting or tachypnea; breath sounds clear and equal bilaterally; no wheezes, no rhonchi, no rales, no hypoxia or respiratory distress, speaking full sentences ABD/GI: Normal bowel sounds; non-distended; soft, non-tender, no  rebound, no guarding, no peritoneal signs, no hepatosplenomegaly BACK:  The back appears normal and is non-tender to palpation, there is no CVA tenderness EXT: Patient has some pain with movement of the left shoulder but no bony tenderness or bony deformity.   Otherwise left arm is normal without tenderness, swelling, bony deformity.  Compartments of the left arm are soft.  2+ left radial pulse.  Sensation to light touch intact diffusely throughout the left arm.  No joint effusions noted.  No ecchymosis, soft tissue swelling, edema.  Normal grip strength in the left arm.  Normal range of motion in all joints of the left arm. SKIN: Normal color for age and race; warm; no rash NEURO: Moves all extremities equally, normal gait, normal speech, normal sensation throughout the left upper extremity PSYCH: The patient's mood and manner are appropriate. Grooming and personal hygiene are appropriate.  MEDICAL DECISION MAKING: Patient here with left-sided chest wall, left breast pain.  There is no breast mass on exam that I can appreciate but she is quite tender over her breast tissue.  No nipple discharge.  No signs of cellulitis, abscess currently.  No lymphadenopathy.  No signs of arterial obstruction, DVT, gout, septic arthritis, fracture or dislocation, compartment syndrome of the left upper extremity.  She is neurovascularly intact distally with normal strength.  Will provide IM Toradol and oral Robaxin for pain.  Will obtain chest x-ray to evaluate lung fields and bones but I have recommended close follow-up with her PCP as I feel she will need a referral to the breast center for an ultrasound.  Discussed this with patient and family at bedside.  I do not think this is ACS.  Her EKG does show T wave abnormalities but this has been constant for several years.  There is no new ischemic change today.I do not think this is a dissection.  She is PERC negative.  I do not think this is a PE.  She is not tachycardic,  tachypneic or hypoxic.  This seems to be musculoskeletal in nature versus coming from the breast tissue.  Symptoms have been ongoing for a month without fevers or overlying skin changes.  Will also obtain pregnancy test.  She is not currently breast-feeding.  Last child was born and 2018 and she breast-fed for 10 months.  ED PROGRESS: Pregnancy test negative.  Chest x-ray shows no acute abnormality.  Patient reports pain has improved after Robaxin and Toradol.  She declines any further pain medication in the ED.  Have advised her to continue ibuprofen at home and alternate this with Tylenol.  Will discharge with prescription of Robaxin.  Requesting a work note for tomorrow which I feel is reasonable.  We will discharge him and have again encouraged outpatient follow-up with her PCP for referral to the breast center for imaging of the left breast.  At this time, I do not feel there is any life-threatening condition present. I have reviewed, interpreted and discussed all results (EKG,  imaging, lab, urine as appropriate) and exam findings with patient/family. I have reviewed nursing notes and appropriate previous records.  I feel the patient is safe to be discharged home without further emergent workup and can continue workup as an outpatient as needed. Discussed usual and customary return precautions. Patient/family verbalize understanding and are comfortable with this plan.  Outpatient follow-up has been provided as needed. All questions have been answered.   Carly Cabrera was evaluated in Emergency Department on 10/08/2019 for the symptoms described in the history of present illness. She was evaluated in the context of the global COVID-19 pandemic, which necessitated consideration that the patient might be at risk for infection with the SARS-CoV-2 virus that causes COVID-19. Institutional protocols and algorithms that pertain to the evaluation of patients at risk for COVID-19 are in a state of rapid change  based on information released by regulatory bodies including the CDC and federal and state organizations. These policies and algorithms were followed during the patient's care in the ED.    EKG Interpretation  Date/Time:  Friday October 08 2019 03:53:52 EDT Ventricular Rate:  87 PR Interval:    QRS Duration: 88 QT Interval:  351 QTC Calculation: 423 R Axis:   31 Text Interpretation: Sinus rhythm Nonspecific T abnormalities, diffuse leads No significant change since last tracing in 2019 Confirmed by Ward, Baxter Hire [phone]) on 10/08/2019 3:56:36 AM         Ward, Layla Maw, DO 10/08/19 2694

## 2019-10-08 NOTE — ED Triage Notes (Signed)
Ms. Lippold presents with one month of constant left sided chest pain that begins beneath her left axilla and goes into her left shoulder and left breast. Nothing changed today that brought her here. She was seen by her PMD one week ago and placed on MSAIDs. The NSaids help for a short time but the pain returns. Movement and turning make the pain worse. She denies nausea, SOB, dizziness and any injury to her shoulder or breast. Denies cough. No recent hospitalizations, non smoker.

## 2019-10-08 NOTE — Discharge Instructions (Addendum)
Your pain is likely musculoskeletal in nature and I recommend that you continue the ibuprofen that you were prescribed and alternate this with Tylenol.  You may alternate Tylenol 1000 mg every 6 hours as needed for pain and Ibuprofen 800 mg every 6 hours as needed for pain.  Please take Ibuprofen with food.  I am also discharging you with a muscle relaxer that can help with pain.  Please do not drive, drink alcohol or take other sedative medications when taking this medication as it may make you drowsy.   Given that you are so tender over your left breast tissue, I recommend that you follow-up with your primary care doctor for a referral to the breast center for possible ultrasound of this breast tissue.  This is not a test that can be done from the emergency department and will have to be done as an outpatient.  Your chest x-ray today was normal.  Your EKG did not show any new changes.  Your pregnancy test was negative.  There is no sign of any infection on exam.

## 2020-01-06 ENCOUNTER — Encounter: Payer: Self-pay | Admitting: Obstetrics and Gynecology

## 2020-01-06 ENCOUNTER — Ambulatory Visit (INDEPENDENT_AMBULATORY_CARE_PROVIDER_SITE_OTHER): Payer: BC Managed Care – PPO | Admitting: Obstetrics and Gynecology

## 2020-01-06 ENCOUNTER — Other Ambulatory Visit: Payer: Self-pay

## 2020-01-06 VITALS — BP 117/78 | HR 80 | Wt 213.0 lb

## 2020-01-06 DIAGNOSIS — Z3046 Encounter for surveillance of implantable subdermal contraceptive: Secondary | ICD-10-CM | POA: Diagnosis not present

## 2020-01-06 NOTE — Progress Notes (Signed)
     GYNECOLOGY OFFICE PROCEDURE NOTE  Carly Cabrera is a 34 y.o. G8T1959 here Nexplanon removal.    Nexplanon Removal Patient identified, informed consent performed, consent signed.   Appropriate time out taken. Nexplanon site identified.  Area prepped in usual sterile fashon. One ml of 1% lidocaine was used to anesthetize the area at the distal end of the implant. A small stab incision was made right beside the implant on the distal portion.  The Nexplanon rod was grasped using hemostats and removed without difficulty.  There was minimal blood loss. There were no complications.  3 ml of 1% lidocaine was injected around the incision for post-procedure analgesia.  Steri-strips were applied over the small incision.  A pressure bandage was applied to reduce any bruising.  The patient tolerated the procedure well and was given post procedure instructions.  Patient is planning to use unsure for contraception/attempt conception. Would like to discuss BTL.    Sheniah Supak, Harolyn Rutherford, NP [facility] [employer], [facility]

## 2020-01-06 NOTE — Progress Notes (Signed)
Wants Nexplanon removed

## 2020-02-15 ENCOUNTER — Ambulatory Visit: Payer: BC Managed Care – PPO | Admitting: Obstetrics & Gynecology

## 2020-02-17 ENCOUNTER — Encounter: Payer: Self-pay | Admitting: General Practice

## 2020-02-17 ENCOUNTER — Encounter: Payer: Self-pay | Admitting: Obstetrics and Gynecology

## 2020-02-17 ENCOUNTER — Ambulatory Visit: Payer: BC Managed Care – PPO | Admitting: Obstetrics and Gynecology

## 2020-02-17 NOTE — Progress Notes (Signed)
Patient did not keep her GYN appointment for 02/17/2020.  Cornelia Copa MD Attending [facility] for [employer] [facility])

## 2020-04-26 ENCOUNTER — Ambulatory Visit: Payer: BC Managed Care – PPO | Attending: Internal Medicine

## 2020-04-26 DIAGNOSIS — Z23 Encounter for immunization: Secondary | ICD-10-CM

## 2020-04-26 NOTE — Progress Notes (Signed)
   Covid-19 Vaccination Clinic  Name:  Carly Cabrera    MRN: [id number] DOB: [birth year]  04/26/2020  [name] was observed post Covid-19 immunization for 15 minutes without incident. She was provided with Vaccine Information Sheet and instruction to access the V-Safe system.   [name] was instructed to call 911 with any severe reactions post vaccine: Marland Kitchen Difficulty breathing  . Swelling of face and throat  . A fast heartbeat  . A bad rash all over body  . Dizziness and weakness   Immunizations Administered    Name Date Dose VIS Date Route   Moderna COVID-19 Vaccine 04/26/2020 11:14 AM 0.5 mL 11/2019 Intramuscular   Manufacturer: Moderna   Lot: [id number]   NDC: 12248-250-03

## 2020-05-24 ENCOUNTER — Ambulatory Visit: Payer: BC Managed Care – PPO | Attending: Internal Medicine

## 2020-05-24 DIAGNOSIS — Z23 Encounter for immunization: Secondary | ICD-10-CM

## 2020-05-24 NOTE — Progress Notes (Signed)
   Covid-19 Vaccination Clinic  Name:  Carly Cabrera    MRN: [id number] DOB: [birth year]  05/24/2020  Carly Cabrera was observed post Covid-19 immunization for 15 minutes without incident. She was provided with Vaccine Information Sheet and instruction to access the V-Safe system.   Carly Cabrera was instructed to call 911 with any severe reactions post vaccine: Marland Kitchen Difficulty breathing  . Swelling of face and throat  . A fast heartbeat  . A bad rash all over body  . Dizziness and weakness   Immunizations Administered    Name Date Dose VIS Date Route   Moderna COVID-19 Vaccine 05/24/2020 11:04 AM 0.5 mL 11/2019 Intramuscular   Manufacturer: Gala Murdoch   Lot: [id number]   NDC: 03709-643-83

## 2020-07-25 ENCOUNTER — Other Ambulatory Visit: Payer: Self-pay

## 2020-07-25 ENCOUNTER — Emergency Department (HOSPITAL_BASED_OUTPATIENT_CLINIC_OR_DEPARTMENT_OTHER): Payer: BC Managed Care – PPO

## 2020-07-25 ENCOUNTER — Encounter (HOSPITAL_BASED_OUTPATIENT_CLINIC_OR_DEPARTMENT_OTHER): Payer: Self-pay | Admitting: Emergency Medicine

## 2020-07-25 ENCOUNTER — Emergency Department (HOSPITAL_BASED_OUTPATIENT_CLINIC_OR_DEPARTMENT_OTHER)
Admission: EM | Admit: 2020-07-25 | Discharge: 2020-07-25 | Disposition: A | Discharge status: Home / Self Care | Payer: BC Managed Care – PPO | Attending: Emergency Medicine | Admitting: Emergency Medicine

## 2020-07-25 DIAGNOSIS — R221 Localized swelling, mass and lump, neck: Secondary | ICD-10-CM | POA: Diagnosis not present

## 2020-07-25 DIAGNOSIS — I10 Essential (primary) hypertension: Secondary | ICD-10-CM | POA: Diagnosis not present

## 2020-07-25 DIAGNOSIS — K1120 Sialoadenitis, unspecified: Secondary | ICD-10-CM | POA: Insufficient documentation

## 2020-07-25 DIAGNOSIS — Z20822 Contact with and (suspected) exposure to covid-19: Secondary | ICD-10-CM | POA: Insufficient documentation

## 2020-07-25 DIAGNOSIS — H9209 Otalgia, unspecified ear: Secondary | ICD-10-CM | POA: Diagnosis present

## 2020-07-25 DIAGNOSIS — Z79899 Other long term (current) drug therapy: Secondary | ICD-10-CM | POA: Diagnosis not present

## 2020-07-25 DIAGNOSIS — R590 Localized enlarged lymph nodes: Secondary | ICD-10-CM

## 2020-07-25 LAB — CBC WITH DIFFERENTIAL/PLATELET
Abs Immature Granulocytes: 0.01 10*3/uL (ref 0.00–0.07)
Basophils Absolute: 0 10*3/uL (ref 0.0–0.1)
Basophils Relative: 1 %
Eosinophils Absolute: 0.1 10*3/uL (ref 0.0–0.5)
Eosinophils Relative: 2 %
HCT: 30.3 % — ABNORMAL LOW (ref 36.0–46.0)
Hemoglobin: 9.2 g/dL — ABNORMAL LOW (ref 12.0–15.0)
Immature Granulocytes: 0 %
Lymphocytes Relative: 43 %
Lymphs Abs: 1.7 10*3/uL (ref 0.7–4.0)
MCH: 22.4 pg — ABNORMAL LOW (ref 26.0–34.0)
MCHC: 30.4 g/dL (ref 30.0–36.0)
MCV: 73.7 fL — ABNORMAL LOW (ref 80.0–100.0)
Monocytes Absolute: 0.5 10*3/uL (ref 0.1–1.0)
Monocytes Relative: 12 %
Neutro Abs: 1.7 10*3/uL (ref 1.7–7.7)
Neutrophils Relative %: 42 %
Platelets: 316 10*3/uL (ref 150–400)
RBC: 4.11 MIL/uL (ref 3.87–5.11)
RDW: 18.4 % — ABNORMAL HIGH (ref 11.5–15.5)
WBC: 4.1 10*3/uL (ref 4.0–10.5)
nRBC: 0 % (ref 0.0–0.2)

## 2020-07-25 LAB — BASIC METABOLIC PANEL
Anion gap: 8 (ref 5–15)
BUN: 6 mg/dL (ref 6–20)
CO2: 25 mmol/L (ref 22–32)
Calcium: 8.7 mg/dL — ABNORMAL LOW (ref 8.9–10.3)
Chloride: 105 mmol/L (ref 98–111)
Creatinine, Ser: 0.65 mg/dL (ref 0.44–1.00)
GFR calc Af Amer: >60 mL/min (ref >60)
GFR calc non Af Amer: >60 mL/min (ref >60)
Glucose, Bld: 101 mg/dL — ABNORMAL HIGH (ref 70–99)
Potassium: 4.1 mmol/L (ref 3.5–5.1)
Sodium: 138 mmol/L (ref 135–145)

## 2020-07-25 LAB — HCG, QUANTITATIVE, PREGNANCY: hCG, Beta Chain, Quant, S: <1 m[IU]/mL (ref <5)

## 2020-07-25 LAB — SARS CORONAVIRUS 2 BY RT PCR (HOSPITAL ORDER, PERFORMED IN ~~LOC~~ HOSPITAL LAB): SARS Coronavirus 2: NEGATIVE

## 2020-07-25 LAB — GROUP A STREP BY PCR: Group A Strep by PCR: NOT DETECTED

## 2020-07-25 MED ORDER — AMOXICILLIN-POT CLAVULANATE 875-125 MG PO TABS: 1.0000 | ORAL_TABLET | Freq: Two times a day (BID) | ORAL | 0 refills | Status: AC

## 2020-07-25 MED ORDER — IOHEXOL 300 MG/ML  SOLN
100.0000 mL | Freq: Once | INTRAMUSCULAR | Status: AC | PRN
  Administered 2020-07-25: 100 mL via INTRAVENOUS

## 2020-07-25 MED ORDER — ACETAMINOPHEN 325 MG PO TABS
650.0000 mg | ORAL_TABLET | Freq: Once | ORAL | Status: AC
  Administered 2020-07-25: 650 mg via ORAL
  Filled 2020-07-25: qty 2

## 2020-07-25 NOTE — ED Triage Notes (Signed)
Patient presents with complaints of swelling to left side of neck and left ear pain; states onset yesterday am; denies fever; states took ibuprofen last dose 1800 yesterday. States pain with swallowing; denies difficulty swallowing.

## 2020-07-25 NOTE — ED Notes (Signed)
Attempted IV without success.  PA aware and MD aware.  PA to attempt US guided IV access.

## 2020-07-25 NOTE — Discharge Instructions (Addendum)
You are leaving the emergency department AGAINST MEDICAL ADVICE.  We strongly encourage you to stay in the emerge department until your work-up is completed.  Leaving AGAINST MEDICAL ADVICE has risks including but not limited to pain, infection, disability and potentially death.  You may return to the emergency department at any time for reevaluation. --------------------------------------------------- UPDATE: 1:19 PM 07/25/2020  At this time there does not appear to be the presence of an emergent medical condition, however there is always the potential for conditions to change. Please read and follow the below instructions.  Please return to the Emergency Department immediately for any new or worsening symptoms or if your symptoms do not improve within 3 days. Please be sure to follow up with your Primary Care Provider within one week regarding your visit today; please call their office to schedule an appointment even if you are feeling better for a follow-up visit. Please take your antibiotic Augmentin as prescribed until complete to help with your symptoms.  Please drink enough water to avoid dehydration and get plenty of rest. Please use sour candies to help with parotid gland inflammation.  Maintaining your water intake will also help with this. Please call the ear nose and throat specialist Dr. Jearld Fenton on your discharge paperwork to schedule follow-up visit. Additionally your CT scan showed an 11 mm low-density lesion in the posterior nasal pharyngeal wall, this may be a retention cyst, discussed that with your primary care provider and your nose and throat specialist at your follow-up visit.  Get help right away if: You have trouble breathing or swallowing. Fluid leaking from an enlarged lymph gland. Severe pain. Chest pain. Shortness of breath. You have neck stiffness  You have fever or chills You have any new/concerning or worsening of symptoms.  Please read the additional information  packets attached to your discharge summary.  Do not take your medicine if  develop an itchy rash, swelling in your mouth or lips, or difficulty breathing; call 911 and seek immediate emergency medical attention if this occurs.  You may review your lab tests and imaging results in their entirety on your MyChart account.  Please discuss all results of fully with your primary care provider and other specialist at your follow-up visit.  Note: Portions of this text may have been transcribed using voice recognition software. Every effort was made to ensure accuracy; however, inadvertent computerized transcription errors may still be present.

## 2020-07-25 NOTE — ED Provider Notes (Signed)
[facility] EMERGENCY DEPARTMENT Provider Note   CSN: [id number] Arrival date & time: 07/25/20  0139     History Chief Complaint  Patient presents with   Otalgia    Carly Cabrera is a 34 y.o. female presents today with left lower jaw and neck swelling onset yesterday.  She reports moderate intensity sharp/throbbing pain constant worsened with jaw movement and palpation no alleviating factors, no radiation of pain.  Denies similar in the past.  Reports increased swelling since yesterday.  Denies fever/chills, headache, vision changes, neck stiffness, difficulty swallowing, voice change, nausea/vomiting, abdominal pain or any additional concerns.  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   History of gestational diabetes 07/28/2015    Past Surgical History:  Procedure Laterality Date   NO PAST SURGERIES       OB History    Gravida  5   Para  5   Term  5   Preterm  0   AB  0   Living  5     SAB  0   TAB  0   Ectopic  0   Multiple  0   Live Births  5           Family History  Problem Relation Age of Onset   Hypertension Father    Diabetes Father     Social History   Tobacco Use   Smoking status: Never Smoker   Smokeless tobacco: Never Used  Substance Use Topics   Alcohol use: No   Drug use: No    Home Medications Prior to Admission medications   Medication Sig Start Date End Date Taking? Authorizing Provider  amLODipine (NORVASC) 10 MG tablet Take 1 tablet (10 mg total) by mouth daily. 06/24/17   Levie Heritage, DO  amoxicillin-clavulanate (AUGMENTIN) 875-125 MG tablet Take 1 tablet by mouth every 12 (twelve) hours for 10 days. 07/25/20 08/04/20  Harlene Salts A, PA-C  ibuprofen (ADVIL,MOTRIN) 600 MG tablet Take 1 tablet (600 mg  total) by mouth every 6 (six) hours as needed. 06/17/17   Arabella Merles, CNM  metFORMIN (GLUCOPHAGE) 500 MG tablet Take by mouth 2 (two) times daily with a meal.    [provider]  methocarbamol (ROBAXIN) 500 MG tablet Take 1 tablet (500 mg total) by mouth every 8 (eight) hours as needed for muscle spasms. 10/08/19   Ward, Layla Maw, DO  omeprazole (PRILOSEC) 40 MG capsule Take 1 capsule (40 mg total) by mouth daily for 14 days. First thing in the morning, 20-30 minutes before eating 01/01/18 01/15/18  Shaune Pollack, MD  Prenatal Vit-Fe Fumarate-FA (MULTIVITAMIN-PRENATAL) 27-0.8 MG TABS tablet Take 1 tablet by mouth daily at 12 noon.    [provider]  sucralfate (CARAFATE) 1 g tablet Take 1 tablet (1 g total) by mouth 4 (four) times daily -  with meals and at bedtime for 14 days. 01/01/18 01/15/18  Shaune Pollack, MD    Allergies    Patient has no known allergies.  Review of Systems   Review of Systems Ten systems are reviewed and are negative for acute change except as noted in the HPI  Physical Exam Updated Vital Signs BP (!) 131/100 (BP Location: Left Arm)    Pulse 63  Temp 98.7 F (37.1 C) (Oral)    Resp 16    Ht 5\' 8"  (1.727 m)    Wt 96.6 kg    LMP 07/24/2020    SpO2 100%    BMI 32.38 kg/m   Physical Exam Constitutional:      General: She is not in acute distress.    Appearance: Normal appearance. She is well-developed. She is not ill-appearing or diaphoretic.  HENT:     Head: Normocephalic and atraumatic. No abrasion.     Jaw: There is normal jaw occlusion. Trismus and tenderness present.      Right Ear: Tympanic membrane and external ear normal. No mastoid tenderness.     Left Ear: Tympanic membrane and external ear normal. No mastoid tenderness.     Nose: Nose normal.     Mouth/Throat:     Mouth: Mucous membranes are moist.     Pharynx: Oropharynx is clear.     Comments: The patient has normal phonation and is in control of secretions. No stridor.   Midline uvula without edema. Soft palate rises symmetrically. No tonsillar erythema, swelling or exudates. Tongue protrusion is normal, floor of mouth is soft. No trismus. No creptius on neck palpation. No gingival erythema or fluctuance noted. Mucus membranes moist. Eyes:     General: Vision grossly intact. Gaze aligned appropriately.     Extraocular Movements: Extraocular movements intact.     Pupils: Pupils are equal, round, and reactive to light.  Neck:     Trachea: Trachea and phonation normal. No tracheal tenderness or tracheal deviation.   Pulmonary:     Effort: Pulmonary effort is normal. No respiratory distress.  Abdominal:     General: There is no distension.     Palpations: Abdomen is soft.     Tenderness: There is no abdominal tenderness. There is no guarding or rebound.  Musculoskeletal:        General: Normal range of motion.     Cervical back: Normal range of motion.  Skin:    General: Skin is warm and dry.  Neurological:     Mental Status: She is alert.     GCS: GCS eye subscore is 4. GCS verbal subscore is 5. GCS motor subscore is 6.     Comments: Speech is clear and goal oriented, follows commands Major Cranial nerves without deficit, no facial droop Moves extremities without ataxia, coordination intact  Psychiatric:        Behavior: Behavior normal.     ED Results / Procedures / Treatments   Labs (all labs ordered are listed, but only abnormal results are displayed) Labs Reviewed  CBC WITH DIFFERENTIAL/PLATELET - Abnormal; Notable for the following components:      Result Value   Hemoglobin 9.2 (*)    HCT 30.3 (*)    MCV 73.7 (*)    MCH 22.4 (*)    RDW 18.4 (*)    All other components within normal limits  BASIC METABOLIC PANEL - Abnormal; Notable for the following components:   Glucose, Bld 101 (*)    Calcium 8.7 (*)    All other components within normal limits  GROUP A STREP BY PCR  SARS CORONAVIRUS 2 BY RT PCR (HOSPITAL ORDER, PERFORMED IN [facility])  HCG, QUANTITATIVE, PREGNANCY  PREGNANCY, URINE    EKG None  Radiology CT Soft Tissue Neck W Contrast  Result Date: 07/25/2020 CLINICAL DATA:  Left neck swelling, left ear pain EXAM: CT NECK WITH CONTRAST TECHNIQUE: Multidetector  CT imaging of the neck was performed using the standard protocol following the bolus administration of intravenous contrast. CONTRAST:  OMNIPAQUE IOHEXOL 300 MG/ML  SOLN COMPARISON:  None. FINDINGS: Pharynx and larynx: Low-density lesions of the mildly prominent posterior nasopharyngeal wall measuring up to 11 mm likely reflect retention cysts. Otherwise unremarkable. Mild left parapharyngeal fat infiltration. Additional superficial and deep subcutaneous fat infiltration in the left neck. Salivary glands: Submandibular and right parotid glands. There is mild left periparotid infiltration. Likely discontiguous mildly enlarged inferior periparotid lymph nodes. Thyroid: Normal. Lymph nodes: As above. Additional asymmetric mildly enlarged internal jugular chain and posterior triangle lymph nodes on the left are likely reactive. Vascular: Major neck vessels are patent. Limited intracranial: No abnormal enhancement. Visualized orbits: Unremarkable. Mastoids and visualized paranasal sinuses: Aerated. Skeleton: No significant abnormality. Upper chest: Included upper lungs are clear. IMPRESSION: Inflammatory changes in the left neck with asymmetric left adenopathy. There is mild left periparotid infiltration and the gland may be primarily or secondary involved. Electronically Signed   By: Guadlupe Spanish M.D.   On: 07/25/2020 13:07    Procedures Procedures (including critical care time)  Medications Ordered in ED Medications  acetaminophen (TYLENOL) tablet 650 mg (650 mg Oral Given 07/25/20 1105)  iohexol (OMNIPAQUE) 300 MG/ML solution 100 mL (100 mLs Intravenous Contrast Given 07/25/20 1243)    ED Course  I have reviewed the triage vital signs and the  nursing notes.  Pertinent labs & imaging results that were available during my care of the patient were reviewed by me and considered in my medical decision making (see chart for details).    MDM Rules/Calculators/A&P                          Additional history obtained from: 1. Nursing notes from this visit. -------------------- 34 year old female history as above presents for swelling and tenderness of the left lower jaw and neck, symptoms began yesterday.  No fever/chills or other infectious type symptoms.  No evidence of skin break or cellulitis.  Oropharynx clear without evidence of dental abscess, Ludwig's angina, retropharyngeal abscess or other intraoral infections.  She has some pain with jaw movement and significant swelling of the left neck.  Possible lymphadenopathy however given size and tenderness concern for possible abscess CT soft tissue neck ordered along with basic labs. ---- I ordered, reviewed and interpreted labs which include: CBC shows hemoglobin of 9.2, similar to 3 years ago slightly lower from prior, no bleeding symptoms.  No evidence of symptomatic anemia.  No leukocytosis. BMP shows no emergent Electra derangement, AKI or gap. Strep test negative. Covid test negative. Beta hCG test negative. -------------------------------------- Unfortunately due to extended wait patient had to leave prior to her CT scan resulting.  Patient was informed that she was leaving AGAINST MEDICAL ADVICE, we had a long conversation about AMA, we discussed risks including but not limited to worsening of pain, infection, disability and potentially death.  Patient stated understanding of those risks.  She has full mental capacity to make her own medical decisions and chose to leave AGAINST MEDICAL ADVICE.  She needed to take her son to a specialist appointment. - Shortly after patient left the building her CT scan resulted reviewed below.  CT soft tissue neck: IMPRESSION:  Inflammatory  changes in the left neck with asymmetric left  adenopathy. There is mild left periparotid infiltration and the  gland may be primarily or secondary involved.  ---- Plan of care is  treatment with Augmentin, sialagogues and ENT referral along with continued home hydration and anti-inflammatories.  I was able to contact patient on her cell phone, confirmed identity.  She states understanding of results above, she has asked that [facility] [address] [place].  She is aware of referral to ENT, she has access to her MyChart account and will follow up on updated instructions there.  She will follow-up with her PCP and ENT.  At this time there does not appear to be any evidence of an acute emergency medical condition and the patient appears stable for discharge with appropriate outpatient follow up. Diagnosis was discussed with patient who verbalizes understanding of care plan and is agreeable to discharge. I have discussed return precautions with patient who verbalizes understanding. Patient encouraged to follow-up with their PCP and ENT. All questions answered.  Patient's case discussed with Dr. Adela LankFloyd who agrees with plan to discharge with follow-up.   Note: Portions of this report may have been transcribed using voice recognition software. Every effort was made to ensure accuracy; however, inadvertent computerized transcription errors may still be present.  Final Clinical Impression(s) / ED Diagnoses Final diagnoses:  Localized swelling, mass and lump, neck  Lymphadenopathy of left cervical region  Parotitis    Rx / DC Orders ED Discharge Orders         Ordered    amoxicillin-clavulanate (AUGMENTIN) 875-125 MG tablet  Every 12 hours     Discontinue  Reprint     07/25/20 1322           Bill SalinasMorelli, Belladonna Lubinski A, PA-C 07/25/20 1324    Melene PlanFloyd, Dan, DO 07/25/20 1509

## 2020-07-25 NOTE — ED Notes (Signed)
Pt leaving AMA r/t having another appointment.

## 2020-09-03 ENCOUNTER — Other Ambulatory Visit: Payer: Self-pay

## 2020-09-03 ENCOUNTER — Emergency Department (HOSPITAL_COMMUNITY): Payer: BC Managed Care – PPO

## 2020-09-03 ENCOUNTER — Encounter (HOSPITAL_COMMUNITY): Payer: Self-pay | Admitting: Emergency Medicine

## 2020-09-03 ENCOUNTER — Emergency Department (HOSPITAL_COMMUNITY)
Admission: EM | Admit: 2020-09-03 | Discharge: 2020-09-03 | Disposition: A | Discharge status: Home / Self Care | Payer: BC Managed Care – PPO | Attending: Emergency Medicine | Admitting: Emergency Medicine

## 2020-09-03 DIAGNOSIS — U071 COVID-19: Secondary | ICD-10-CM | POA: Diagnosis not present

## 2020-09-03 DIAGNOSIS — Z79899 Other long term (current) drug therapy: Secondary | ICD-10-CM | POA: Insufficient documentation

## 2020-09-03 DIAGNOSIS — I10 Essential (primary) hypertension: Secondary | ICD-10-CM | POA: Diagnosis not present

## 2020-09-03 DIAGNOSIS — R0602 Shortness of breath: Secondary | ICD-10-CM | POA: Diagnosis present

## 2020-09-03 MED ORDER — NAPROXEN 500 MG PO TABS: 500.0000 mg | ORAL_TABLET | Freq: Two times a day (BID) | ORAL | 0 refills | Status: AC

## 2020-09-03 MED ORDER — ALBUTEROL SULFATE HFA 108 (90 BASE) MCG/ACT IN AERS
2.0000 | INHALATION_SPRAY | Freq: Once | RESPIRATORY_TRACT | Status: AC
  Administered 2020-09-03: 2 via RESPIRATORY_TRACT
  Filled 2020-09-03: qty 6.7

## 2020-09-03 NOTE — ED Triage Notes (Signed)
Pt reports SOB and loss of smell.  Tested + for COVID on Monday.  States her cough has improved.

## 2020-09-03 NOTE — ED Provider Notes (Signed)
[facility] EMERGENCY DEPARTMENT Provider Note   CSN: [id number] Arrival date & time: 09/03/20  1210     History Chief Complaint  Patient presents with  . Covid Positive  . Shortness of Breath    Carly Cabrera is a 34 y.o. female resenting for evaluation of shortness of breath and chest pressure.  Patient states she developed symptoms on the 17th, 9 days ago.  She had short-lived fever, body aches, cough.  Recently her cough has been improving, however with past 24 she has had slightly worsening shortness of breath.  She reports associated chest pressure in the center of her chest, but this is improved at rest while in the ER.  She also reports recent loss of taste and smell.  She denies current fever, chills, nausea, vomiting, abdominal pain, urinary symptoms, normal bowel movements.  She did get both Covid vaccines.  She denies sick contacts.  She has a history of diabetes, reports no other medical problems.  She denies recent travel, surgeries, mobilization, history of cancer, history of previous DVT/PE, hormone use.  Additional history obtained from chart review.  Patient with a history of hypertension and diabetes  HPI     Past Medical History:  Diagnosis Date  . Gestational diabetes   . Gestational diabetes mellitus in pregnancy   . Hypertension     Patient Active Problem List   Diagnosis Date Noted  . 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 History of gestational diabetes 07/28/2015    Past Surgical History:  Procedure Laterality Date  . NO PAST SURGERIES       OB History    Gravida  5   Para  5   Term  5   Preterm  0   AB  0   Living  5     SAB  0   TAB  0   Ectopic  0   Multiple  0   Live Births  5           Family History  Problem Relation Age of Onset  . Hypertension Father   . Diabetes Father     Social History   Tobacco Use  . Smoking status: Never Smoker  . Smokeless tobacco: Never Used  Substance Use Topics    . Alcohol use: No  . Drug use: No    Home Medications Prior to Admission medications   Medication Sig Start Date End Date Taking? Authorizing Provider  amLODipine (NORVASC) 10 MG tablet Take 1 tablet (10 mg total) by mouth daily. 06/24/17   Levie Heritage, DO  ibuprofen (ADVIL,MOTRIN) 600 MG tablet Take 1 tablet (600 mg total) by mouth every 6 (six) hours as needed. 06/17/17   Arabella Merles, CNM  metFORMIN (GLUCOPHAGE) 500 MG tablet Take by mouth 2 (two) times daily with a meal.    [provider]  methocarbamol (ROBAXIN) 500 MG tablet Take 1 tablet (500 mg total) by mouth every 8 (eight) hours as needed for muscle spasms. 10/08/19   Ward, Layla Maw, DO  naproxen (NAPROSYN) 500 MG tablet Take 1 tablet (500 mg total) by mouth 2 (two) times daily with a meal for 7 days. 09/03/20 09/10/20  Loyd Marhefka, PA-C  omeprazole (PRILOSEC) 40 MG capsule Take 1 capsule (40 mg total) by mouth daily for 14 days. First thing in the morning, 20-30 minutes before eating 01/01/18 01/15/18  Shaune Pollack, MD  Prenatal Vit-Fe Fumarate-FA (MULTIVITAMIN-PRENATAL) 27-0.8 MG TABS tablet Take 1 tablet by mouth  daily at 12 noon.    [provider]  sucralfate (CARAFATE) 1 g tablet Take 1 tablet (1 g total) by mouth 4 (four) times daily -  with meals and at bedtime for 14 days. 01/01/18 01/15/18  Shaune Pollack, MD    Allergies    Patient has no known allergies.  Review of Systems   Review of Systems  Respiratory: Positive for shortness of breath.   Cardiovascular: Positive for chest pain.  All other systems reviewed and are negative.   Physical Exam Updated Vital Signs BP 106/72 (BP Location: Right Arm)   Pulse 82   Temp 98.2 F (36.8 C) (Oral)   Resp 16   SpO2 100%   Physical Exam Vitals and nursing note reviewed.  Constitutional:      General: She is not in acute distress.    Appearance: She is well-developed.     Comments: Resting in the bed in no acute distress  HENT:      Head: Normocephalic and atraumatic.  Eyes:     Conjunctiva/sclera: Conjunctivae normal.     Pupils: Pupils are equal, round, and reactive to light.  Cardiovascular:     Rate and Rhythm: Normal rate and regular rhythm.     Pulses: Normal pulses.     Comments: Pt's HR increases to 105 with ambulation, returns to 70s with rest.  Pulmonary:     Effort: Pulmonary effort is normal. No respiratory distress.     Breath sounds: Normal breath sounds. No wheezing.     Comments: Speaking in full sentences.  Clear lung sounds in all fields.  No hypoxia with ambulation. Abdominal:     General: There is no distension.     Palpations: Abdomen is soft. There is no mass.     Tenderness: There is no abdominal tenderness. There is no guarding or rebound.  Musculoskeletal:        General: Normal range of motion.     Cervical back: Normal range of motion and neck supple.     Right lower leg: No edema.     Left lower leg: No edema.  Skin:    General: Skin is warm and dry.     Capillary Refill: Capillary refill takes less than 2 seconds.  Neurological:     Mental Status: She is alert and oriented to person, place, and time.     ED Results / Procedures / Treatments   Labs (all labs ordered are listed, but only abnormal results are displayed) Labs Reviewed - No data to display  EKG EKG Interpretation  Date/Time:  Sunday September 03 2020 12:38:38 EDT Ventricular Rate:  95 PR Interval:  146 QRS Duration: 86 QT Interval:  340 QTC Calculation: 427 R Axis:   16 Text Interpretation: Normal sinus rhythm Left ventricular hypertrophy with repolarization abnormality ( R in aVL ) Abnormal ECG No significant change since last tracing Confirmed by Susy Frizzle [phone]) on 09/03/2020 3:09:55 PM   Radiology DG Chest Portable 1 View  Result Date: 09/03/2020 CLINICAL DATA:  Shortness of breath. EXAM: PORTABLE CHEST 1 VIEW COMPARISON:  June 27, 2020 FINDINGS: The heart size and mediastinal contours are  within normal limits. Both lungs are clear. The visualized skeletal structures are unremarkable. IMPRESSION: No active disease. Electronically Signed   By: Ted Mcalpine M.D.   On: 09/03/2020 13:21    Procedures Procedures (including critical care time)  Medications Ordered in ED Medications  albuterol (VENTOLIN HFA) 108 (90 Base) MCG/ACT inhaler 2 puff (has  no administration in time range)    ED Course  I have reviewed the triage vital signs and the nursing notes.  Pertinent labs & imaging results that were available during my care of the patient were reviewed by me and considered in my medical decision making (see chart for details).    MDM Rules/Calculators/A&P                          Patient presenting for evaluation of slightly worsening shortness of breath and chest pressure in the setting of a Covid infection.  She is 9 days and.  All of her symptoms are improving.  While patient does not become hypoxic with ambulation and does not appear in any respiratory distress, she does become mildly tachycardic with ambulation.  As such, concern for possible PE.  Also consider pleurisy versus costochondritis in the setting of a viral illness.  Chest x-ray obtained from triage read interpreted by me, no pneumonia with auscultation, cardiomegaly.  EKG without ischemia.  Discussed plan for CTA with patient, however patient is concerned about timing and does not want stay in the ER.  She states she has kids at home, and does not want to wait to get the CTA.  I stressed importance of continued work-up to rule out PE, patient states she understands was to wants to leave.  As such, will treat symptomatically and have patient follow-up with her primary care doctor for further evaluation.  Patient encouraged to return to the ER with any worsening symptoms or as wanted for further evaluation.  Strict return precautions given.  Patient states she understands and agrees to plan.  Final Clinical  Impression(s) / ED Diagnoses Final diagnoses:  COVID-19    Rx / DC Orders ED Discharge Orders         Ordered    naproxen (NAPROSYN) 500 MG tablet  2 times daily with meals        09/03/20 1541           Fatih Stalvey, PA-C 09/03/20 1557    Pollyann Savoy, MD 09/03/20 1724

## 2020-09-03 NOTE — Discharge Instructions (Addendum)
Use the inhaler every 4 hours for the next 2 days while awake.  After that, use as needed for chest tightness, shortness of breath, or wheezing. Take naproxen 2 times a day with meals.  Do not take other anti-inflammatories at the same time (Advil, Motrin, ibuprofen, Aleve). You may supplement with Tylenol if you need further pain control. Call your primary care doctor to see if they can set up an outpatient CT scan, as we were not able to completely rule out whether you have a PE (blood clot) in your lungs.  If you are unable to follow-up with them, you may also follow-up with the [place] [facility] for further evaluation. Return to the emergency room if you develop worsening pain, increase shortness of breath, or any new or worsening, concerning symptoms.

## 2020-09-12 ENCOUNTER — Telehealth: Payer: Self-pay

## 2020-09-12 NOTE — Telephone Encounter (Signed)
Pt was called per referral from [facility]. No answer, unable to lvm

## 2020-10-14 IMAGING — CR DG CHEST 1V PORT
1 series · 1 of 1 positions shown · non-contrast
Comparison: June 27, 2020

CLINICAL DATA: Shortness of breath.

EXAM:
PORTABLE CHEST 1 VIEW

[AP]
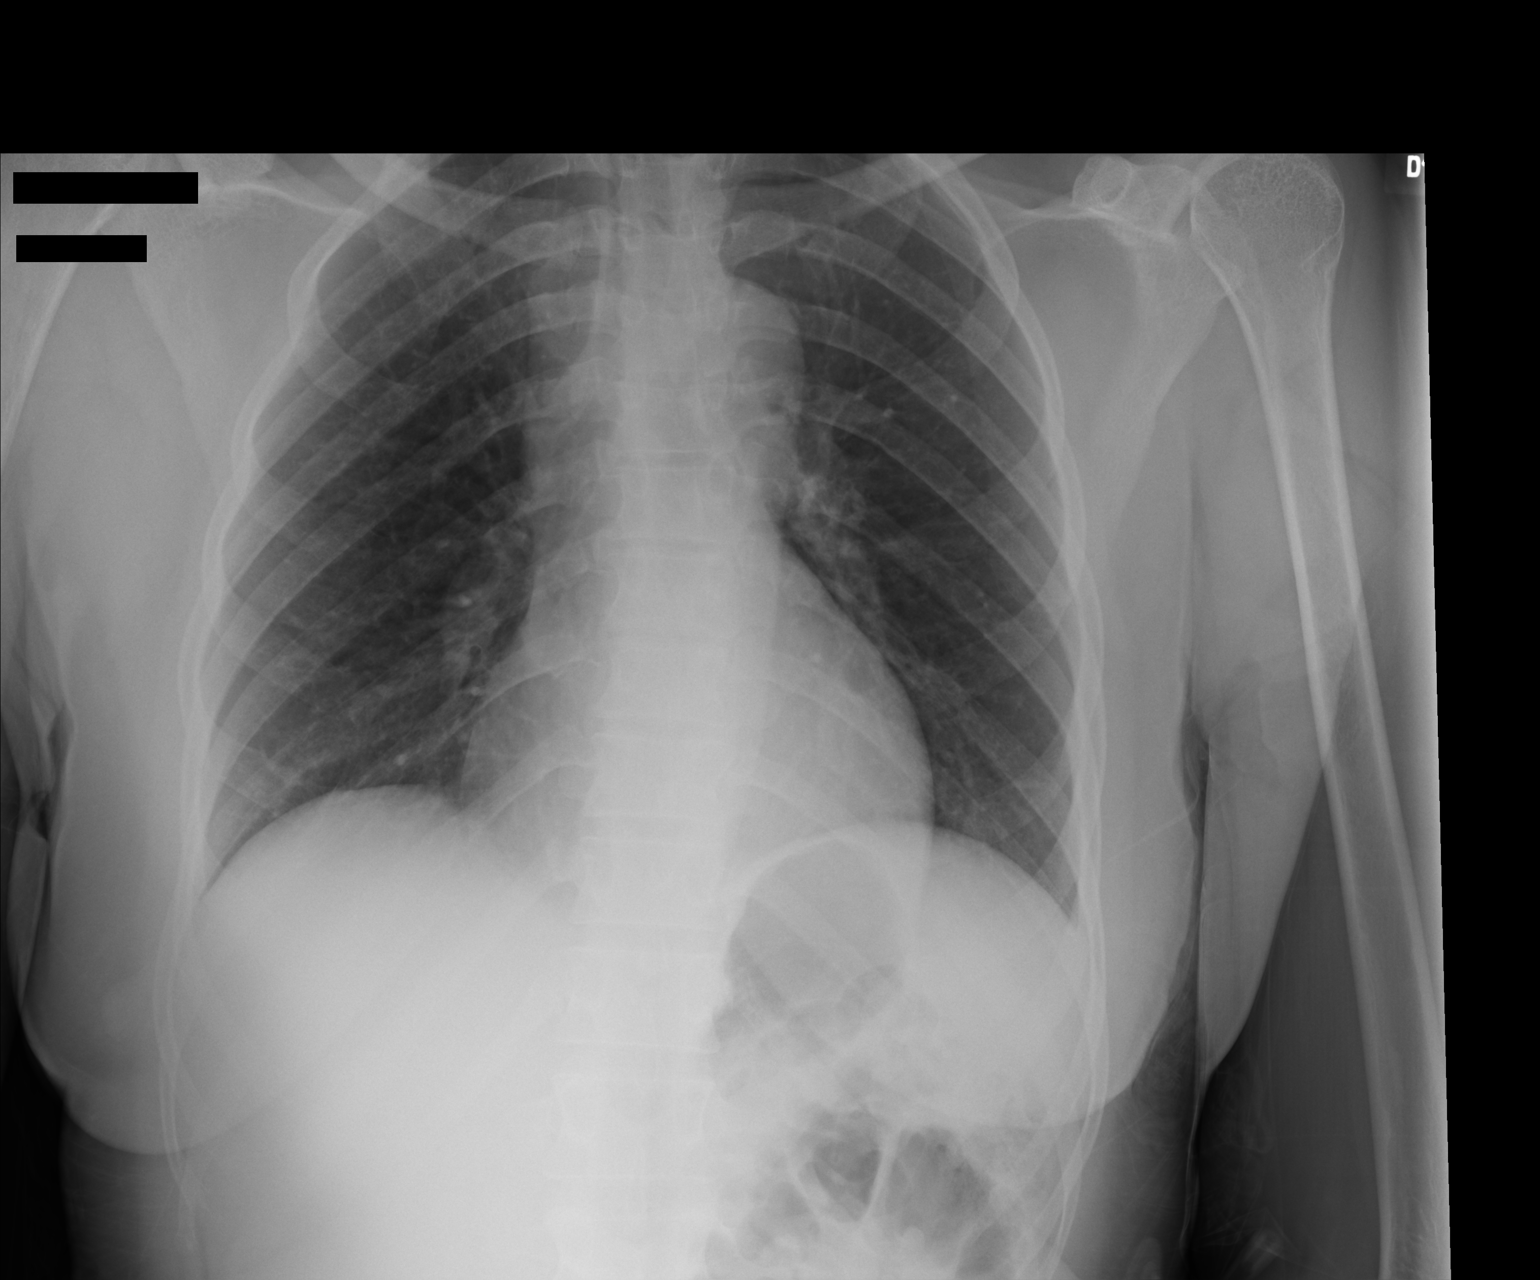

[1 of 1 positions shown; findings below may reference images not displayed]

FINDINGS: The heart size and mediastinal contours are within normal limits.
Both lungs are clear. The visualized skeletal structures are
unremarkable.
IMPRESSION: No active disease.

## 2021-01-18 DIAGNOSIS — I10 Essential (primary) hypertension: Secondary | ICD-10-CM | POA: Insufficient documentation

## 2021-01-18 DIAGNOSIS — Z789 Other specified health status: Secondary | ICD-10-CM | POA: Insufficient documentation

## 2021-08-01 ENCOUNTER — Emergency Department (HOSPITAL_BASED_OUTPATIENT_CLINIC_OR_DEPARTMENT_OTHER)
Admission: EM | Admit: 2021-08-01 | Discharge: 2021-08-01 | Disposition: A | Discharge status: Home / Self Care | Payer: Medicaid Other | Attending: Emergency Medicine | Admitting: Emergency Medicine

## 2021-08-01 ENCOUNTER — Other Ambulatory Visit: Payer: Self-pay

## 2021-08-01 ENCOUNTER — Encounter (HOSPITAL_BASED_OUTPATIENT_CLINIC_OR_DEPARTMENT_OTHER): Payer: Self-pay

## 2021-08-01 DIAGNOSIS — R1030 Lower abdominal pain, unspecified: Secondary | ICD-10-CM | POA: Diagnosis present

## 2021-08-01 DIAGNOSIS — N390 Urinary tract infection, site not specified: Secondary | ICD-10-CM | POA: Insufficient documentation

## 2021-08-01 DIAGNOSIS — I10 Essential (primary) hypertension: Secondary | ICD-10-CM | POA: Diagnosis not present

## 2021-08-01 DIAGNOSIS — Z7984 Long term (current) use of oral hypoglycemic drugs: Secondary | ICD-10-CM | POA: Insufficient documentation

## 2021-08-01 DIAGNOSIS — Z79899 Other long term (current) drug therapy: Secondary | ICD-10-CM | POA: Diagnosis not present

## 2021-08-01 LAB — URINALYSIS, ROUTINE W REFLEX MICROSCOPIC
Bilirubin Urine: NEGATIVE
Glucose, UA: NEGATIVE mg/dL
Ketones, ur: NEGATIVE mg/dL
Nitrite: NEGATIVE
Protein, ur: 100 mg/dL — AB
Specific Gravity, Urine: 1.02 (ref 1.005–1.030)
pH: 6 (ref 5.0–8.0)

## 2021-08-01 LAB — URINALYSIS, MICROSCOPIC (REFLEX): RBC / HPF: >50 RBC/hpf (ref 0–5)

## 2021-08-01 LAB — PREGNANCY, URINE: Preg Test, Ur: NEGATIVE

## 2021-08-01 MED ORDER — CEPHALEXIN 500 MG PO CAPS: 500.0000 mg | ORAL_CAPSULE | Freq: Two times a day (BID) | ORAL | 0 refills | Status: DC

## 2021-08-01 MED ORDER — CEPHALEXIN 500 MG PO CAPS: 500.0000 mg | ORAL_CAPSULE | Freq: Two times a day (BID) | ORAL | 0 refills | Status: AC

## 2021-08-01 MED ORDER — CEPHALEXIN 250 MG PO CAPS
500.0000 mg | ORAL_CAPSULE | Freq: Once | ORAL | Status: AC
  Administered 2021-08-01: 500 mg via ORAL
  Filled 2021-08-01: qty 2

## 2021-08-01 NOTE — ED Provider Notes (Signed)
[facility] EMERGENCY DEPARTMENT Provider Note   CSN: [id number] Arrival date & time: 08/01/21  1840     History Chief Complaint  Patient presents with   Abdominal Pain    Carly Cabrera is a 35 y.o. female with PMHx HTN who presents to the ED Today with complaint of gradual onset, constant, cramping, suprapubic abdominal pain that began yesterday. Pt also complains of urinary frequency and urgency. She has been taking Ibuprofen without relief. She denies hx of same. She finished her menstrual cycle 2 days ago. She denies fevers however endorses chills. No nausea, vomiting, vaginal discharge, pelvic pain. She has never had these symptoms in the past.   The history is provided by the patient and medical records.      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   History of gestational diabetes 07/28/2015    Past Surgical History:  Procedure Laterality Date   NO PAST SURGERIES       OB History     Gravida  5   Para  5   Term  5   Preterm  0   AB  0   Living  5      SAB  0   IAB  0   Ectopic  0   Multiple  0   Live Births  5           Family History  Problem Relation Age of Onset   Hypertension Father    Diabetes Father     Social History   Tobacco Use   Smoking status: Never   Smokeless tobacco: Never  Vaping Use   Vaping Use: Never used  Substance Use Topics   Alcohol use: No   Drug use: No    Home Medications Prior to Admission medications   Medication Sig Start Date End Date Taking? Authorizing Provider  cephALEXin (KEFLEX) 500 MG capsule Take 1 capsule (500 mg total) by mouth 2 (two) times daily for 5 days. 08/01/21 08/06/21 Yes Kearah Gayden, PA-C  amLODipine (NORVASC) 10 MG tablet Take 1 tablet (10 mg total) by mouth daily. 06/24/17   Levie Heritage,  DO  ibuprofen (ADVIL,MOTRIN) 600 MG tablet Take 1 tablet (600 mg total) by mouth every 6 (six) hours as needed. 06/17/17   Arabella Merles, CNM  metFORMIN (GLUCOPHAGE) 500 MG tablet Take by mouth 2 (two) times daily with a meal.    [provider]  methocarbamol (ROBAXIN) 500 MG tablet Take 1 tablet (500 mg total) by mouth every 8 (eight) hours as needed for muscle spasms. 10/08/19   Ward, Layla Maw, DO  omeprazole (PRILOSEC) 40 MG capsule Take 1 capsule (40 mg total) by mouth daily for 14 days. First thing in the morning, 20-30 minutes before eating 01/01/18 01/15/18  Shaune Pollack, MD  Prenatal Vit-Fe Fumarate-FA (MULTIVITAMIN-PRENATAL) 27-0.8 MG TABS tablet Take 1 tablet by mouth daily at 12 noon.    [provider]  sucralfate (CARAFATE) 1 g tablet Take 1 tablet (1 g total) by mouth 4 (four) times daily -  with meals and at bedtime for 14 days. 01/01/18 01/15/18  Shaune Pollack, MD    Allergies    Patient has no known allergies.  Review of Systems   Review of Systems  Constitutional:  Positive for  chills. Negative for fever.  Gastrointestinal:  Positive for abdominal pain. Negative for constipation, diarrhea, nausea and vomiting.  Genitourinary:  Positive for frequency and urgency. Negative for flank pain, pelvic pain and vaginal discharge.  All other systems reviewed and are negative.  Physical Exam Updated Vital Signs BP 114/86 (BP Location: Left Arm)   Pulse (!) 101   Temp 99.6 F (37.6 C) (Oral)   Resp 18   Ht 5\' 8"  (1.727 m)   Wt 85.3 kg   LMP 07/28/2021   SpO2 100%   BMI 28.59 kg/m   Physical Exam Vitals and nursing note reviewed.  Constitutional:      Appearance: She is not ill-appearing or diaphoretic.  HENT:     Head: Normocephalic and atraumatic.  Eyes:     Conjunctiva/sclera: Conjunctivae normal.  Cardiovascular:     Rate and Rhythm: Normal rate and regular rhythm.     Heart sounds: Normal heart sounds.  Pulmonary:     Effort: Pulmonary  effort is normal.     Breath sounds: Normal breath sounds. No wheezing, rhonchi or rales.  Abdominal:     General: Abdomen is flat.     Palpations: Abdomen is soft.     Tenderness: There is abdominal tenderness in the suprapubic area. There is no right CVA tenderness, left CVA tenderness, guarding or rebound.  Musculoskeletal:     Cervical back: Neck supple.  Skin:    General: Skin is warm and dry.  Neurological:     Mental Status: She is alert.    ED Results / Procedures / Treatments   Labs (all labs ordered are listed, but only abnormal results are displayed) Labs Reviewed  URINALYSIS, ROUTINE W REFLEX MICROSCOPIC - Abnormal; Notable for the following components:      Result Value   APPearance CLOUDY (*)    Hgb urine dipstick LARGE (*)    Protein, ur 100 (*)    Leukocytes,Ua SMALL (*)    All other components within normal limits  URINALYSIS, MICROSCOPIC (REFLEX) - Abnormal; Notable for the following components:   Bacteria, UA FEW (*)    All other components within normal limits  URINE CULTURE  PREGNANCY, URINE    EKG None  Radiology No results found.  Procedures Procedures   Medications Ordered in ED Medications  cephALEXin (KEFLEX) capsule 500 mg (has no administration in time range)    ED Course  I have reviewed the triage vital signs and the nursing notes.  Pertinent labs & imaging results that were available during my care of the patient were reviewed by me and considered in my medical decision making (see chart for details).    MDM Rules/Calculators/A&P                           35 year old female presenting to the ED today with complaint of suprapubic pain, frequency, and urgency that began yesterday. On arrival to the ED pt's temp 99.6 and tachycardic at 101 however this has resolved in the room. She is overall well appearing/nontoxic appearing. She is noted to have TTP to the suprapubic area of the abdomen. NO tenderness throughout remainder of  abdomen. Abdomen is soft. She denies vaginal discharge or specific pelvic pain. LNMP finished 2 days ago. Will plan for U/A at this time as I am suspicious of UTI. Given no vaginal discharge I am less suspicious for pelvic etiology however if urine unequivocal will need to perform pelvic exam.  Given she is young and otherwise healthy will hold off on labwork at this time.   UPT negative U/A with small leuks, > 50 RBCs, 21-50 WBCs, few bacteria, WBC clumps consistent with UTI. Will discharge home at this time with keflex. Urine culture sent. Pt advised to have repeat U/A in about 1-2 weeks. She is in agreement with plan and stable for discharge home.   This note was prepared using Dragon voice recognition software and may include unintentional dictation errors due to the inherent limitations of voice recognition software.   Final Clinical Impression(s) / ED Diagnoses Final diagnoses:  Lower urinary tract infectious disease    Rx / DC Orders ED Discharge Orders          Ordered    cephALEXin (KEFLEX) 500 MG capsule  2 times daily        08/01/21 2107             Discharge Instructions      Please pick up antibiotics and take as prescribed to cover for your urinary tract infection  Follow up with your PCP in 1-2 weeks to have your urine rechecked to ensure you have cleared the infection  We have sent your urine for culture and will call you in about 2-3 days time IF the antibiotic needs to be changed.   Return to the ED IMMEDIATELY for any new/worsening symptoms       Tanda Rockers, Cordelia Poche 08/01/21 2108    Gwyneth Sprout, MD 08/01/21 [phone]

## 2021-08-01 NOTE — Discharge Instructions (Addendum)
Please pick up antibiotics and take as prescribed to cover for your urinary tract infection  Follow up with your PCP in 1-2 weeks to have your urine rechecked to ensure you have cleared the infection  We have sent your urine for culture and will call you in about 2-3 days time IF the antibiotic needs to be changed.   Return to the ED IMMEDIATELY for any new/worsening symptoms

## 2021-08-01 NOTE — ED Triage Notes (Signed)
Pt c/o lower abd pain started yesterday-denies v/d-NAD-steady gait

## 2021-08-04 LAB — URINE CULTURE: Culture: 80000 — AB

## 2021-08-05 ENCOUNTER — Telehealth: Payer: Self-pay | Admitting: Emergency Medicine

## 2021-08-05 NOTE — Telephone Encounter (Signed)
Post ED Visit - Positive Culture Follow-up  Culture report reviewed by antimicrobial stewardship pharmacist: [facility] []  , Pharm.D. []  Enzo Bi, Pharm.D., BCPS AQ-ID []  , Pharm.D., BCPS []  Celedonio Miyamoto, Pharm.D., BCPS []  [place], Garvin Fila.D., BCPS, AAHIVP []  , Pharm.D., BCPS, AAHIVP []  Georgina Pillion, PharmD, BCPS []  , PharmD, BCPS []  [place], PharmD, BCPS []  [address], PharmD [x]  , PharmD, BCPS []  Estella Husk, PharmD  Pharmacy Team []  Lysle Pearl, PharmD []  , PharmD []  Phillips Climes, PharmD []  , Rph []  Agapito Games) , PharmD []  Verlan Friends, PharmD []  , PharmD []  Delmar Landau, PharmD []  , PharmD []  Vinnie Level, PharmD []  [facility], PharmD []  , PharmD []  Len Childs, PharmD   Positive urine culture Treated with Cephalexin, organism sensitive to the same and no further patient follow-up is required at this time.  [name] 08/05/2021, 5:01 PM

## 2022-06-14 DIAGNOSIS — M545 Low back pain, unspecified: Secondary | ICD-10-CM | POA: Insufficient documentation

## 2022-06-17 ENCOUNTER — Emergency Department (HOSPITAL_BASED_OUTPATIENT_CLINIC_OR_DEPARTMENT_OTHER)
Admission: EM | Admit: 2022-06-17 | Discharge: 2022-06-17 | Disposition: A | Discharge status: Home / Self Care | Payer: Medicaid Other | Attending: Emergency Medicine | Admitting: Emergency Medicine

## 2022-06-17 ENCOUNTER — Other Ambulatory Visit: Payer: Self-pay

## 2022-06-17 ENCOUNTER — Encounter (HOSPITAL_BASED_OUTPATIENT_CLINIC_OR_DEPARTMENT_OTHER): Payer: Self-pay

## 2022-06-17 ENCOUNTER — Emergency Department (HOSPITAL_BASED_OUTPATIENT_CLINIC_OR_DEPARTMENT_OTHER): Payer: Medicaid Other

## 2022-06-17 DIAGNOSIS — I10 Essential (primary) hypertension: Secondary | ICD-10-CM | POA: Insufficient documentation

## 2022-06-17 DIAGNOSIS — Z79899 Other long term (current) drug therapy: Secondary | ICD-10-CM | POA: Insufficient documentation

## 2022-06-17 DIAGNOSIS — M79604 Pain in right leg: Secondary | ICD-10-CM | POA: Diagnosis present

## 2022-06-17 MED ORDER — NAPROXEN 375 MG PO TABS: 375.0000 mg | ORAL_TABLET | Freq: Two times a day (BID) | ORAL | 0 refills | Status: DC

## 2022-06-17 NOTE — ED Triage Notes (Signed)
C/o RLE pain since yesterday. Pain worse with ambulation. Denies injury. Sent by UC to r/o DVT

## 2022-06-17 NOTE — Discharge Instructions (Signed)
Take medications as needed for pain.  Follow-up with your doctor to be rechecked next week if the symptoms persist.  Monitor for fevers chills worsening symptoms

## 2022-06-17 NOTE — ED Provider Notes (Signed)
[facility] EMERGENCY DEPARTMENT Provider Note   CSN: [id number] Arrival date & time: 06/17/22  1524     History  Chief Complaint  Patient presents with   Leg Pain    Carly Cabrera is a 36 y.o. female.   Leg Pain   Patient has a history of gestational diabetes, malignant hyperthermia, hypertension presents with complaints of right legs pain.  Patient denies any recent trips or travel but has noticed pain in her calf going down her foot.  She also feels like it somewhat numb.  Pain increases with activity and walking.  She has not had any fevers or chills no swelling.  No prior history of DVT.  She went to an urgent care today and they were concerned about the possibility of DVT so she was sent to the ED for evaluation  Home Medications Prior to Admission medications   Medication Sig Start Date End Date Taking? Authorizing Provider  naproxen (NAPROSYN) 375 MG tablet Take 1 tablet (375 mg total) by mouth 2 (two) times daily. 06/17/22  Yes Linwood Dibbles, MD  amLODipine (NORVASC) 10 MG tablet Take 1 tablet (10 mg total) by mouth daily. 06/24/17   Levie Heritage, DO  ibuprofen (ADVIL,MOTRIN) 600 MG tablet Take 1 tablet (600 mg total) by mouth every 6 (six) hours as needed. 06/17/17   Arabella Merles, CNM  metFORMIN (GLUCOPHAGE) 500 MG tablet Take by mouth 2 (two) times daily with a meal.    [provider]  methocarbamol (ROBAXIN) 500 MG tablet Take 1 tablet (500 mg total) by mouth every 8 (eight) hours as needed for muscle spasms. 10/08/19   Ward, Layla Maw, DO  omeprazole (PRILOSEC) 40 MG capsule Take 1 capsule (40 mg total) by mouth daily for 14 days. First thing in the morning, 20-30 minutes before eating 01/01/18 01/15/18  Shaune Pollack, MD  Prenatal Vit-Fe Fumarate-FA (MULTIVITAMIN-PRENATAL) 27-0.8 MG TABS tablet Take 1 tablet by mouth daily at 12 noon.    [provider]  sucralfate (CARAFATE) 1 g tablet Take 1 tablet (1 g total) by mouth 4 (four) times  daily -  with meals and at bedtime for 14 days. 01/01/18 01/15/18  Shaune Pollack, MD      Allergies    Patient has no known allergies.    Review of Systems   Review of Systems  Physical Exam Updated Vital Signs BP 114/79 (BP Location: Left Arm)   Pulse 89   Temp 98.4 F (36.9 C) (Oral)   Resp 16   Ht 1.727 m (5\' 8" )   Wt 87.1 kg   LMP 06/05/2022 (Exact Date)   SpO2 100%   BMI 29.19 kg/m  Physical Exam Vitals and nursing note reviewed.  Constitutional:      General: She is not in acute distress.    Appearance: She is well-developed.  HENT:     Head: Normocephalic and atraumatic.     Right Ear: External ear normal.     Left Ear: External ear normal.  Eyes:     General: No scleral icterus.       Right eye: No discharge.        Left eye: No discharge.     Conjunctiva/sclera: Conjunctivae normal.  Neck:     Trachea: No tracheal deviation.  Cardiovascular:     Rate and Rhythm: Normal rate and regular rhythm.  Pulmonary:     Effort: Pulmonary effort is normal. No respiratory distress.     Breath sounds: Normal breath  sounds. No stridor.  Abdominal:     General: There is no distension.  Musculoskeletal:        General: Tenderness present. No swelling or deformity.     Cervical back: Neck supple.     Comments: Tenderness palpation right calf, no significant edema, no erythema, palpable dorsalis pedis pulse bilaterally  Skin:    General: Skin is warm and dry.     Findings: No rash.  Neurological:     Mental Status: She is alert.     Cranial Nerves: Cranial nerve deficit: no gross deficits.     ED Results / Procedures / Treatments   Labs (all labs ordered are listed, but only abnormal results are displayed) Labs Reviewed - No data to display  EKG None  Radiology US Venous Img Lower Unilateral Right  Result Date: 06/17/2022 CLINICAL DATA:  Calf pain EXAM: RIGHT LOWER EXTREMITY VENOUS DOPPLER ULTRASOUND TECHNIQUE: Gray-scale sonography with compression, as well  as color and duplex ultrasound, were performed to evaluate the deep venous system(s) from the level of the common femoral vein through the popliteal and proximal calf veins. COMPARISON:  None Available. FINDINGS: VENOUS Normal compressibility of the common femoral, superficial femoral, and popliteal veins, as well as the visualized calf veins. Visualized portions of profunda femoral vein and great saphenous vein unremarkable. No filling defects to suggest DVT on grayscale or color Doppler imaging. Doppler waveforms show normal direction of venous flow, normal respiratory plasticity and response to augmentation. Limited views of the contralateral common femoral vein are unremarkable. OTHER None. Limitations: none IMPRESSION: No evidence of right lower extremity deep vein thrombosis. Electronically Signed   By: Allegra Lai M.D.   On: 06/17/2022 17:09    Procedures Procedures    Medications Ordered in ED Medications - No data to display  ED Course/ Medical Decision Making/ A&P                           Medical Decision Making Risk Prescription drug management.   Pt presents with calf pain.  No swelling or redness noted.  [place] does not show a dvt.  No radicular symptoms.   No infection.  No vascular compromise.  ?muscle strain.  Will dc with nsaids.  Follow up with pcp        Final Clinical Impression(s) / ED Diagnoses Final diagnoses:  Right leg pain    Rx / DC Orders ED Discharge Orders          Ordered    naproxen (NAPROSYN) 375 MG tablet  2 times daily        06/17/22 1730              Linwood Dibbles, MD 06/17/22 1732

## 2022-06-28 DIAGNOSIS — M79606 Pain in leg, unspecified: Secondary | ICD-10-CM | POA: Insufficient documentation

## 2022-07-16 ENCOUNTER — Ambulatory Visit (INDEPENDENT_AMBULATORY_CARE_PROVIDER_SITE_OTHER): Payer: Medicaid Other | Admitting: Family Medicine

## 2022-07-16 ENCOUNTER — Encounter: Payer: Self-pay | Admitting: Family Medicine

## 2022-07-16 VITALS — BP 102/70 | Ht 68.0 in | Wt 192.0 lb

## 2022-07-16 DIAGNOSIS — M5416 Radiculopathy, lumbar region: Secondary | ICD-10-CM

## 2022-07-16 MED ORDER — PREDNISONE 5 MG PO TABS: ORAL_TABLET | ORAL | 0 refills | Status: DC

## 2022-07-16 NOTE — Progress Notes (Signed)
  Carly Cabrera - 36 y.o. female MRN [id number]  Date of birth: October 16, 1986  SUBJECTIVE:  Including CC & ROS.  No chief complaint on file.   Carly Cabrera is a 36 y.o. female that is presenting with low back pain with associated radicular type pain down each leg.  Pain has been ongoing for a week.  No injury inciting event.  No history of surgery.  Review of the notes from her primary care so she was provided naproxen and Flexeril. Review of the x-ray lumbar spine from 7/7 shows no acute changes.  Review of Systems See HPI   HISTORY: Past Medical, Surgical, Social, and Family History Reviewed & Updated per EMR.   Pertinent Historical Findings include:  Past Medical History:  Diagnosis Date   Gestational diabetes    Gestational diabetes mellitus in pregnancy    Hypertension     Past Surgical History:  Procedure Laterality Date   NO PAST SURGERIES       PHYSICAL EXAM:  VS: BP 102/70 (BP Location: Left Arm, Patient Position: Sitting)   Ht 5\' 8"  (1.727 m)   Wt 192 lb (87.1 kg)   LMP 06/05/2022 (Exact Date)   BMI 29.19 kg/m  Physical Exam Gen: NAD, alert, cooperative with exam, well-appearing MSK:  Neurovascularly intact       ASSESSMENT & PLAN:   Lumbar radiculopathy Acutely occurring.  Symptoms most consistent with a nerve impingement.  She is unable to do physical therapy at this time. -Counseled on home exercise therapy and supportive care. -Prednisone. -Could consider further imaging or SI joint injection.    06/07/2022

## 2022-07-16 NOTE — Assessment & Plan Note (Signed)
Acutely occurring.  Symptoms most consistent with a nerve impingement.  She is unable to do physical therapy at this time. -Counseled on home exercise therapy and supportive care. -Prednisone. -Could consider further imaging or SI joint injection.

## 2022-07-16 NOTE — Patient Instructions (Signed)
Nice to meet you ?Please try heat  ?Please try the exercises   ?Please send me a message in MyChart with any questions or updates.  ?Please see me back in 3 weeks.  ? ?--Dr. Aranza Geddes ? ?

## 2022-08-07 ENCOUNTER — Telehealth (INDEPENDENT_AMBULATORY_CARE_PROVIDER_SITE_OTHER): Payer: Medicaid Other | Admitting: Family Medicine

## 2022-08-07 DIAGNOSIS — M5416 Radiculopathy, lumbar region: Secondary | ICD-10-CM

## 2022-08-07 MED ORDER — GABAPENTIN 300 MG PO CAPS: 300.0000 mg | ORAL_CAPSULE | Freq: Three times a day (TID) | ORAL | 1 refills | Status: DC

## 2022-08-07 NOTE — Progress Notes (Signed)
Virtual Visit via Video Note  I connected with Carly Cabrera on 08/07/22 at  3:10 PM EDT by a video enabled telemedicine application and verified that I am speaking with the correct person using two identifiers.  Location: Patient: work Provider: office   I discussed the limitations of evaluation and management by telemedicine and the availability of in person appointments. The patient expressed understanding and agreed to proceed.  History of Present Illness:  Carly Cabrera is a 36 year old female that is following up for her lumbar radiculopathy.  She had improvement with the prednisone but the pain has since returned.  She notices the pain when she is standing or doing activities at work.   Observations/Objective:   Assessment and Plan:  Lumbar radiculopathy: Acutely worsening after the course of prednisone.  She notices the pain in when she is active or lifting things up. -Counseled on home exercise therapy and supportive care. -referral to physical therapy. -Gabapentin. - xray.  - could consider further imaging.   Follow Up Instructions:    I discussed the assessment and treatment plan with the patient. The patient was provided an opportunity to ask questions and all were answered. The patient agreed with the plan and demonstrated an understanding of the instructions.   The patient was advised to call back or seek an in-person evaluation if the symptoms worsen or if the condition fails to improve as anticipated.   Clare Gandy, MD

## 2022-08-07 NOTE — Assessment & Plan Note (Signed)
Acutely worsening after the course of prednisone.  She notices the pain in when she is active or lifting things up. -Counseled on home exercise therapy and supportive care. -referral to physical therapy. -Gabapentin. - xray.  - could consider further imaging.

## 2022-08-16 ENCOUNTER — Ambulatory Visit (HOSPITAL_BASED_OUTPATIENT_CLINIC_OR_DEPARTMENT_OTHER)
Admission: RE | Admit: 2022-08-16 | Discharge: 2022-08-16 | Disposition: A | Discharge status: Home / Self Care | Payer: Medicaid Other | Source: Ambulatory Visit | Attending: Family Medicine | Admitting: Family Medicine

## 2022-08-16 DIAGNOSIS — M5416 Radiculopathy, lumbar region: Secondary | ICD-10-CM

## 2022-08-17 ENCOUNTER — Emergency Department (HOSPITAL_BASED_OUTPATIENT_CLINIC_OR_DEPARTMENT_OTHER)
Admission: EM | Admit: 2022-08-17 | Discharge: 2022-08-17 | Disposition: A | Discharge status: Home / Self Care | Payer: Medicaid Other | Attending: Emergency Medicine | Admitting: Emergency Medicine

## 2022-08-17 ENCOUNTER — Encounter (HOSPITAL_BASED_OUTPATIENT_CLINIC_OR_DEPARTMENT_OTHER): Payer: Self-pay | Admitting: Emergency Medicine

## 2022-08-17 ENCOUNTER — Other Ambulatory Visit: Payer: Self-pay

## 2022-08-17 ENCOUNTER — Emergency Department (HOSPITAL_BASED_OUTPATIENT_CLINIC_OR_DEPARTMENT_OTHER): Payer: Medicaid Other

## 2022-08-17 DIAGNOSIS — R072 Precordial pain: Secondary | ICD-10-CM | POA: Diagnosis not present

## 2022-08-17 DIAGNOSIS — E119 Type 2 diabetes mellitus without complications: Secondary | ICD-10-CM | POA: Diagnosis not present

## 2022-08-17 DIAGNOSIS — R0602 Shortness of breath: Secondary | ICD-10-CM | POA: Insufficient documentation

## 2022-08-17 DIAGNOSIS — Z7984 Long term (current) use of oral hypoglycemic drugs: Secondary | ICD-10-CM | POA: Insufficient documentation

## 2022-08-17 DIAGNOSIS — Z79899 Other long term (current) drug therapy: Secondary | ICD-10-CM | POA: Insufficient documentation

## 2022-08-17 DIAGNOSIS — I10 Essential (primary) hypertension: Secondary | ICD-10-CM | POA: Insufficient documentation

## 2022-08-17 LAB — CBC
HCT: 28.2 % — ABNORMAL LOW (ref 36.0–46.0)
Hemoglobin: 8.4 g/dL — ABNORMAL LOW (ref 12.0–15.0)
MCH: 20.2 pg — ABNORMAL LOW (ref 26.0–34.0)
MCHC: 29.8 g/dL — ABNORMAL LOW (ref 30.0–36.0)
MCV: 68 fL — ABNORMAL LOW (ref 80.0–100.0)
Platelets: 285 10*3/uL (ref 150–400)
RBC: 4.15 MIL/uL (ref 3.87–5.11)
RDW: 20 % — ABNORMAL HIGH (ref 11.5–15.5)
WBC: 10.2 10*3/uL (ref 4.0–10.5)
nRBC: 0 % (ref 0.0–0.2)

## 2022-08-17 LAB — TROPONIN I (HIGH SENSITIVITY)
Troponin I (High Sensitivity): <2 ng/L (ref <18)
Troponin I (High Sensitivity): <2 ng/L (ref <18)

## 2022-08-17 LAB — BASIC METABOLIC PANEL
Anion gap: 5 (ref 5–15)
BUN: 14 mg/dL (ref 6–20)
CO2: 25 mmol/L (ref 22–32)
Calcium: 8.8 mg/dL — ABNORMAL LOW (ref 8.9–10.3)
Chloride: 107 mmol/L (ref 98–111)
Creatinine, Ser: 0.76 mg/dL (ref 0.44–1.00)
GFR, Estimated: >60 mL/min (ref >60)
Glucose, Bld: 114 mg/dL — ABNORMAL HIGH (ref 70–99)
Potassium: 3.5 mmol/L (ref 3.5–5.1)
Sodium: 137 mmol/L (ref 135–145)

## 2022-08-17 LAB — D-DIMER, QUANTITATIVE: D-Dimer, Quant: 0.31 ug/mL-FEU (ref 0.00–0.50)

## 2022-08-17 LAB — PREGNANCY, URINE: Preg Test, Ur: NEGATIVE

## 2022-08-17 MED ORDER — KETOROLAC TROMETHAMINE 15 MG/ML IJ SOLN
15.0000 mg | Freq: Once | INTRAMUSCULAR | Status: AC
  Administered 2022-08-17: 15 mg via INTRAMUSCULAR
  Filled 2022-08-17: qty 1

## 2022-08-17 MED ORDER — ACETAMINOPHEN 325 MG PO TABS
650.0000 mg | ORAL_TABLET | Freq: Once | ORAL | Status: AC
Start: 2022-08-17 — End: 2022-08-17
  Administered 2022-08-17: 650 mg via ORAL
  Filled 2022-08-17: qty 2

## 2022-08-17 NOTE — ED Provider Notes (Signed)
[facility] Provider Note   CSN: [id number] Arrival date & time: 08/17/22  1929     History  Chief Complaint  Patient presents with   Chest Pain    Carly Cabrera is a 36 y.o. female.  Patient with history of hypertension, diabetes --presents to the emergency department today for evaluation of chest pain.  Patient states that she awoke with pain around 2 AM.  She has not had pain like this in the past.  It started in the right side of the chest and then over the course of a few hours moved to the left side.  She was up most of the night but was able to sleep for couple of hours.  After waking up she states that she had a burning pain in the top of her head as well.  She did go to work with her symptoms.  She had some shortness of breath while moving around at work today, but no persistent shortness of breath.  Pain was waxing and waning.  Upon returning home she felt the pain more prompting emergency department visit.  She denies associated diaphoresis or vomiting.  No fevers or cough.  No abdominal pain.  No treatments prior to arrival.  Symptoms are not changed with eating or drinking.  Patient denies risk factors for pulmonary embolism including: unilateral leg swelling, history of DVT/PE/other blood clots, use of exogenous hormones, recent immobilizations, recent surgery, recent travel (>4hr segment), malignancy, hemoptysis. DVT study 06/2022 was negative.          Home Medications Prior to Admission medications   Medication Sig Start Date End Date Taking? Authorizing Provider  amLODipine (NORVASC) 10 MG tablet Take 1 tablet (10 mg total) by mouth daily. 06/24/17   Truett Mainland, DO  gabapentin (NEURONTIN) 300 MG capsule Take 1 capsule (300 mg total) by mouth 3 (three) times daily. 08/07/22   Rosemarie Ax, MD  ibuprofen (ADVIL,MOTRIN) 600 MG tablet Take 1 tablet (600 mg total) by mouth every 6 (six) hours as needed. 06/17/17   Myrtis Ser, CNM   metFORMIN (GLUCOPHAGE) 500 MG tablet Take by mouth 2 (two) times daily with a meal.    [provider]  methocarbamol (ROBAXIN) 500 MG tablet Take 1 tablet (500 mg total) by mouth every 8 (eight) hours as needed for muscle spasms. 10/08/19   Ward, Delice Bison, DO  naproxen (NAPROSYN) 375 MG tablet Take 1 tablet (375 mg total) by mouth 2 (two) times daily. 06/17/22   Dorie Rank, MD  omeprazole (PRILOSEC) 40 MG capsule Take 1 capsule (40 mg total) by mouth daily for 14 days. First thing in the morning, 20-30 minutes before eating 01/01/18 01/15/18  Duffy Bruce, MD  predniSONE (DELTASONE) 5 MG tablet Take 6 pills for first day, 5 pills second day, 4 pills third day, 3 pills fourth day, 2 pills the fifth day, and 1 pill sixth day. 07/16/22   Rosemarie Ax, MD  Prenatal Vit-Fe Fumarate-FA (MULTIVITAMIN-PRENATAL) 27-0.8 MG TABS tablet Take 1 tablet by mouth daily at 12 noon.    [provider]  sucralfate (CARAFATE) 1 g tablet Take 1 tablet (1 g total) by mouth 4 (four) times daily -  with meals and at bedtime for 14 days. 01/01/18 01/15/18  Duffy Bruce, MD      Allergies    Patient has no known allergies.    Review of Systems   Review of Systems  Physical Exam Updated Vital Signs BP  116/83   Pulse 64   Temp 98.7 F (37.1 C) (Oral)   Resp 18   Ht 5\' 8"  (1.727 m)   LMP 07/28/2022   SpO2 100%   BMI 29.19 kg/m  Physical Exam Vitals and nursing note reviewed.  Constitutional:      Appearance: She is well-developed. She is not diaphoretic.  HENT:     Head: Normocephalic and atraumatic.     Mouth/Throat:     Mouth: Mucous membranes are not dry.  Eyes:     Conjunctiva/sclera: Conjunctivae normal.  Neck:     Vascular: Normal carotid pulses. No JVD.     Trachea: Trachea normal. No tracheal deviation.  Cardiovascular:     Rate and Rhythm: Normal rate and regular rhythm.     Pulses: No decreased pulses.          Radial pulses are 2+ on the right side and 2+ on the left  side.     Heart sounds: Normal heart sounds, S1 normal and S2 normal. No murmur heard. Pulmonary:     Effort: Pulmonary effort is normal. No respiratory distress.     Breath sounds: No wheezing.  Chest:     Chest wall: No tenderness.  Abdominal:     General: Bowel sounds are normal.     Palpations: Abdomen is soft.     Tenderness: There is no abdominal tenderness. There is no guarding or rebound.  Musculoskeletal:        General: Normal range of motion.     Cervical back: Normal range of motion and neck supple. No muscular tenderness.     Right lower leg: No tenderness. No edema.     Left lower leg: No tenderness. No edema.  Skin:    General: Skin is warm and dry.     Coloration: Skin is not pale.  Neurological:     Mental Status: She is alert.     ED Results / Procedures / Treatments   Labs (all labs ordered are listed, but only abnormal results are displayed) Labs Reviewed  BASIC METABOLIC PANEL - Abnormal; Notable for the following components:      Result Value   Glucose, Bld 114 (*)    Calcium 8.8 (*)    All other components within normal limits  CBC - Abnormal; Notable for the following components:   Hemoglobin 8.4 (*)    HCT 28.2 (*)    MCV 68.0 (*)    MCH 20.2 (*)    MCHC 29.8 (*)    RDW 20.0 (*)    All other components within normal limits  PREGNANCY, URINE  D-DIMER, QUANTITATIVE  TROPONIN I (HIGH SENSITIVITY)  TROPONIN I (HIGH SENSITIVITY)    EKG EKG Interpretation  Date/Time:  Saturday August 17 2022 19:40:49 EDT Ventricular Rate:  81 PR Interval:  158 QRS Duration: 84 QT Interval:  346 QTC Calculation: 401 R Axis:   20 Text Interpretation: Normal sinus rhythm Nonspecific T wave abnormality No significant change since last tracing When compared with ECG of 03-Sep-2020 12:38, PREVIOUS ECG IS PRESENT Confirmed by Blanchie Dessert [phone]) on 08/17/2022 8:10:04 PM  Radiology DG Chest 2 View  Result Date: 08/17/2022 CLINICAL DATA:  Right-sided chest  pain EXAM: CHEST - 2 VIEW COMPARISON:  Radiographs 09/03/2020 FINDINGS: No focal consolidation, pleural effusion, or pneumothorax. Normal cardiomediastinal silhouette. No acute osseous abnormality. IMPRESSION: No active cardiopulmonary disease. Electronically Signed   By: Placido Sou M.D.   On: 08/17/2022 20:10    Procedures Procedures  Medications Ordered in ED Medications  ketorolac (TORADOL) 15 MG/ML injection 15 mg (15 mg Intramuscular Given 08/17/22 2244)  acetaminophen (TYLENOL) tablet 650 mg (650 mg Oral Given 08/17/22 2243)    ED Course/ Medical Decision Making/ A&P    Patient seen and examined. History obtained directly from patient. Work-up including labs, imaging, EKG ordered in triage, if performed, were reviewed.    Labs/EKG: Independently reviewed and interpreted.  This included: CBC with microcytic anemia hemoglobin 8.4 which is slightly lower than the patient's baseline but a chronic issue, normal white blood cell count; BMP glucose 114 otherwise unremarkable; troponin less than 2; pregnancy test negative.  Added D-dimer.  Patient low risk Wells.  EKG personally reviewed and interpreted as above.  No ischemic findings or changes from previous.  Imaging: Independently visualized and interpreted.  This included: Chest x-ray, agree negative.  Medications/Fluids: None ordered  Most recent vital signs reviewed and are as follows: BP 116/83   Pulse 64   Temp 98.7 F (37.1 C) (Oral)   Resp 18   Ht 5\' 8"  (1.727 m)   LMP 07/28/2022   SpO2 100%   BMI 29.19 kg/m   Initial impression: Atypical chest pain  11:21 PM Reassessment performed. Patient appears comfortable.  Labs personally reviewed and interpreted including: Second troponin also less than 2, D-dimer negative.  Reviewed pertinent lab work and imaging with patient at bedside. Questions answered.   Most current vital signs reviewed and are as follows: BP (!) 122/92   Pulse 60   Temp 98.7 F (37.1 C) (Oral)    Resp 17   Ht 5\' 8"  (1.727 m)   LMP 07/28/2022   SpO2 100%   BMI 29.19 kg/m   Plan: We will give IM Toradol and p.o. Tylenol for pain relief.  Plan for discharge to home.  Return and follow-up instructions: I encouraged patient to return to ED with severe chest pain, especially if the pain is crushing or pressure-like and spreads to the arms, back, neck, or jaw, or if they have associated sweating, vomiting, or shortness of breath with the pain, or significant pain with activity. We discussed that the evaluation here today indicates a low-risk of serious cause of chest pain, including heart trouble or a blood clot, but no evaluation is perfect and chest pain can evolve with time. The patient verbalized understanding and agreed.  I encouraged patient to follow-up with their provider in the next 48-72 hours for recheck.                             Medical Decision Making Amount and/or Complexity of Data Reviewed Labs: ordered. Radiology: ordered.  Risk OTC drugs. Prescription drug management.   For this patient's complaint of chest pain, the following emergent conditions were considered on the differential diagnosis: acute coronary syndrome, pulmonary embolism, pneumothorax, myocarditis, pericardial tamponade, aortic dissection, thoracic aortic aneurysm complication, esophageal perforation.   Other causes were also considered including: gastroesophageal reflux disease, musculoskeletal pain including costochondritis, pneumonia/pleurisy, herpes zoster, pericarditis.  In regards to possibility of ACS, patient has atypical features of pain, non-ischemic and unchanged EKG and negative troponin(s). Heart score was calculated to be 2.   In regards to possibility of PE, symptoms are atypical for PE and risk profile (Wells) is low with negative d-dimer.   The patient's vital signs, pertinent lab work and imaging were reviewed and interpreted as discussed in the ED course. Hospitalization was  considered for  further testing, treatments, or serial exams/observation. However as patient is well-appearing, has a stable exam, and reassuring studies today, I do not feel that they warrant admission at this time. This plan was discussed with the patient who verbalizes agreement and comfort with this plan and seems reliable and able to return to the Emergency Department with worsening or changing symptoms.          Final Clinical Impression(s) / ED Diagnoses Final diagnoses:  Precordial pain    Rx / DC Orders ED Discharge Orders     None         Renne Crigler, PA-C 08/17/22 2323    Gwyneth Sprout, MD 08/20/22 1506

## 2022-08-17 NOTE — ED Triage Notes (Signed)
Patient c/o right sided chest pain since 2 am this morning radiates into her right arm. Patient also reports pain to the top of her head. Denies n/v or shortness of breath.

## 2022-08-17 NOTE — Discharge Instructions (Signed)
Please read and follow all provided instructions.  Your diagnoses today include:  1. Precordial pain     Tests performed today include: An EKG of your heart A chest x-ray Cardiac enzymes - a blood test for heart muscle damage Blood counts and electrolytes D-dimer: screening test for blood clot was negative Vital signs. See below for your results today.   Medications prescribed:  None  Take any prescribed medications only as directed.  Follow-up instructions: Please follow-up with your primary care provider as soon as you can for further evaluation of your symptoms.   Return instructions:  SEEK IMMEDIATE MEDICAL ATTENTION IF: You have severe chest pain, especially if the pain is crushing or pressure-like and spreads to the arms, back, neck, or jaw, or if you have sweating, nausea or vomiting, or trouble with breathing. THIS IS AN EMERGENCY. Do not wait to see if the pain will go away. Get medical help at once. Call 911. DO NOT drive yourself to the hospital.  Your chest pain gets worse and does not go away after a few minutes of rest.  You have an attack of chest pain lasting longer than what you usually experience.  You have significant dizziness, if you pass out, or have trouble walking.  You have chest pain not typical of your usual pain for which you originally saw your caregiver.  You have any other emergent concerns regarding your health.  Additional Information: Chest pain comes from many different causes. Your caregiver has diagnosed you as having chest pain that is not specific for one problem, but does not require admission.  You are at low risk for an acute heart condition or other serious illness.   Your vital signs today were: BP 117/84   Pulse (!) 59   Temp 98.7 F (37.1 C) (Oral)   Resp 15   Ht 5\' 8"  (1.727 m)   LMP 07/28/2022   SpO2 100%   BMI 29.19 kg/m  If your blood pressure (BP) was elevated above 135/85 this visit, please have this repeated by your  doctor within one month. --------------

## 2022-08-19 ENCOUNTER — Ambulatory Visit: Payer: Medicaid Other | Admitting: Physical Therapy

## 2022-08-20 ENCOUNTER — Telehealth: Payer: Self-pay | Admitting: Family Medicine

## 2022-08-20 NOTE — Telephone Encounter (Signed)
Informed of results.   Myra Rude, MD [facility] 08/20/2022, 3:32 PM

## 2022-08-26 ENCOUNTER — Ambulatory Visit: Payer: Medicaid Other | Admitting: Physical Therapy

## 2022-09-09 ENCOUNTER — Ambulatory Visit: Payer: Medicaid Other | Admitting: Physical Therapy

## 2022-09-16 ENCOUNTER — Ambulatory Visit: Payer: Medicaid Other | Attending: Family Medicine | Admitting: Physical Therapy

## 2022-09-16 ENCOUNTER — Encounter: Payer: Self-pay | Admitting: Physical Therapy

## 2022-09-16 DIAGNOSIS — M5441 Lumbago with sciatica, right side: Secondary | ICD-10-CM | POA: Diagnosis not present

## 2022-09-16 DIAGNOSIS — M5416 Radiculopathy, lumbar region: Secondary | ICD-10-CM | POA: Insufficient documentation

## 2022-09-16 DIAGNOSIS — R252 Cramp and spasm: Secondary | ICD-10-CM | POA: Diagnosis present

## 2022-09-16 DIAGNOSIS — M6281 Muscle weakness (generalized): Secondary | ICD-10-CM | POA: Diagnosis present

## 2022-09-16 NOTE — Therapy (Signed)
OUTPATIENT PHYSICAL THERAPY THORACOLUMBAR EVALUATION   Patient Name: Carly Cabrera MRN: [id number] DOB:1986/04/10, 36 y.o., female Today's Date: 09/16/2022   PT End of Session - 09/16/22 1203     Visit Number 1    Number of Visits 8    Date for PT Re-Evaluation 11/15/22    Authorization Type [employer] pending authorization    PT Start Time 1105    PT Stop Time 1145    PT Time Calculation (min) 40 min    Activity Tolerance Patient tolerated treatment well    Behavior During Therapy [facility] for tasks assessed/performed             Past Medical History:  Diagnosis Date   Gestational diabetes    Gestational diabetes mellitus in pregnancy    Hypertension    Past Surgical History:  Procedure Laterality Date   NO PAST SURGERIES     Patient Active Problem List   Diagnosis Date Noted   Lumbar radiculopathy 07/16/2022   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   History of gestational diabetes 07/28/2015    PCP: [facility]  REFERRING PROVIDER: Myra Rude., MD  REFERRING DIAG: M54.16 (ICD-10-CM) - Lumbar radiculopathy  Rationale for Evaluation and Treatment Rehabilitation  THERAPY DIAG:  Acute midline low back pain with right-sided sciatica  Cramp and spasm  Muscle weakness (generalized)  ONSET DATE: July 2023  SUBJECTIVE:                                                                                                                                                                                           SUBJECTIVE STATEMENT: This pain has been a while, I don't remember, may since July.  It just started as a little bit of pain in low back, but just got worse, went to the dr. Quincy Carnes gave me muscle relaxors and MHP, but the pain got out of control, going down legs, went to ED, couldn't find anything wrong, referred to Dr. Jordan Likes who sent her to PT.    PERTINENT HISTORY:  5 children ages 33- 30 years  PAIN:  Are you having pain? Yes:  NPRS scale: 6-7/10 Pain location: midline low back, worst 10/10 Pain description: aches, sometimes sharp Aggravating factors: sleeping, laying down, walking long distances, prolonged sitting and standing Relieving factors: pain medicine   PRECAUTIONS: None  WEIGHT BEARING RESTRICTIONS No  FALLS:  Has patient fallen in last 6 months? No  LIVING ENVIRONMENT: Lives with: lives with their family Lives in: House/apartment Stairs: Yes: External: 7 steps; on right going up Has following equipment at home: None  OCCUPATION: part time work, Public affairs consultant  PLOF: Independent  PATIENT GOALS decrease pain   OBJECTIVE:   DIAGNOSTIC FINDINGS:  Imaging 08/16/22 DG lumbar spine  FINDINGS: There is no evidence of lumbar spine fracture. Alignment is normal. Intervertebral disc spaces are maintained.   IMPRESSION: Negative.  PATIENT SURVEYS:  Modified Oswestry 23/50 = 46% severe disability  FOTO 33% ability   SCREENING FOR RED FLAGS: Bowel or bladder incontinence: No Spinal tumors: No Cauda equina syndrome: No Compression fracture: No Abdominal aneurysm: No  COGNITION:  Overall cognitive status: Within functional limits for tasks assessed     SENSATION: WFL  MUSCLE LENGTH: NT due to pain  POSTURE: increased lumbar lordosis  PALPATION: Diffuse tenderness throughout lumbar parasinals and glutes.    LUMBAR ROM:   Active  A/PROM  eval  Flexion Limited 90% by pain  Extension Limited 90% by pain  Right lateral flexion Limited 90% by pain  Left lateral flexion Limited 90% by pain  Right rotation Limited 90% by pain  Left rotation Limited 90% by pain   (Blank rows = not tested)  LOWER EXTREMITY ROM:   NT due to pain.   Passive  Right eval Left eval  Hip flexion    Hip extension    Hip abduction    Hip adduction    Hip internal rotation    Hip external rotation    Knee flexion    Knee extension    Ankle dorsiflexion    Ankle plantarflexion    Ankle inversion     Ankle eversion     (Blank rows = not tested)  LOWER EXTREMITY MMT:    MMT Right* eval Left* eval  Hip flexion 3+ * 4 *  Hip extension    Hip abduction 4* 4*  Hip adduction 3+* 4*  Knee flexion 3+ 4  Knee extension 3+ 4  Ankle dorsiflexion 3+ 4  Ankle plantarflexion 3 4   (Blank rows = not tested) * all LE MMT increased LB pain  LUMBAR SPECIAL TESTS:  Immediate report of pain with all movement, for example picking up heel for SLR increased pain bil at 5 deg.  Unable to get into position for FABER or other special tests.   FUNCTIONAL TESTS:  5 times sit to stand: could not complete.  Only 2 STS in 20 seconds with 1 UE assist and increased pain.   GAIT: Distance walked: 20' Assistive device utilized: None Level of assistance: Complete Independence Comments: visually slow gait speed    TODAY'S TREATMENT  09/16/2022 Modalities: Estim (IFC) + MHP to L/S (not charged)    PATIENT EDUCATION:  Education details: education on findings and POC Person educated: Patient Education method: Explanation Education comprehension: verbalized understanding   HOME EXERCISE PROGRAM: TBD  ASSESSMENT:  CLINICAL IMPRESSION: Patient is a 36 y.o. female who was seen today for physical therapy evaluation and treatment for lumbar radiculopathy.  She reports increased low back pain with all movements and had poor tolerance to activity today, demonstrating very limited lumbar ROM, increased pain with LE MMT and unable to tolerate supine positioning, strong preference for prone positioning, but even then not able to perform even prone knee bend.   She does report radicular symptoms have improved but still note weakness in RLE compared to LLE and reports difficulty driving due to weakness.  She had diffuse tenderness throughout bil lumbar paraspinals and glutes.  Due to level of pain, given MHP + estim in prone to decrease her pain.  She would benefit from skilled physical therapy to  decrease pain,  improve core and LE strength, and improve activity tolerance.     OBJECTIVE IMPAIRMENTS decreased endurance, decreased mobility, difficulty walking, decreased ROM, decreased strength, hypomobility, increased fascial restrictions, impaired perceived functional ability, increased muscle spasms, impaired flexibility, postural dysfunction, and pain.   ACTIVITY LIMITATIONS carrying, lifting, bending, sitting, standing, squatting, sleeping, stairs, transfers, bed mobility, locomotion level, and caring for others  PARTICIPATION LIMITATIONS: meal prep, cleaning, laundry, driving, shopping, community activity, and occupation  PERSONAL FACTORS Time since onset of injury/illness/exacerbation are also affecting patient's functional outcome.   REHAB POTENTIAL: Good  CLINICAL DECISION MAKING: Stable/uncomplicated  EVALUATION COMPLEXITY: Low   GOALS: Goals reviewed with patient? Yes  SHORT TERM GOALS: Target date: 09/30/2022   Patient will be independent with initial HEP.  Baseline:  Goal status: INITIAL  2.  Patient will demonstrate 4+/5 bil LE strength for safety with driving.   Baseline: see objective Goal status: INITIAL   LONG TERM GOALS: Target date: 10/28/2022    Patient will be independent with advanced/ongoing HEP to improve outcomes and carryover.  Baseline:  Goal status: INITIAL  2.  Patient will report 75% improvement in low back pain to improve QOL.  Baseline: 6-10/10 Goal status: INITIAL  3.  Patient will demonstrate full pain free lumbar ROM to perform ADLs.   Baseline: see objective Goal status: INITIAL  4.  Patient will demonstrate improved functional strength as demonstrated by 5x STS < 30 sec. Baseline: 2 sit to stands in 20 seconds, unable to continue Goal status: INITIAL  5.  Patient will report 52% on lumbar FOTO to demonstrate improved functional ability.  Baseline: 33% Goal status: INITIAL   6.  Patient will tolerate 1 hour min of standing to perform job  activities without increased LBP. Baseline:  Goal status: INITIAL   PLAN: PT FREQUENCY: 1x/week  PT DURATION: 8 weeks  PLANNED INTERVENTIONS: Therapeutic exercises, Therapeutic activity, Neuromuscular re-education, Balance training, Gait training, Patient/Family education, Self Care, Joint mobilization, Stair training, Dry Needling, Electrical stimulation, Spinal mobilization, Cryotherapy, Moist heat, Traction, Ultrasound, Manual therapy, and Re-evaluation.  PLAN FOR NEXT SESSION: try MHP at beginning to see if will tolerate supine exercises, give info on TENS   Rennie Natter, PT, DPT  09/16/2022, 12:07 PM

## 2022-09-20 ENCOUNTER — Ambulatory Visit: Payer: Medicaid Other | Admitting: Physical Therapy

## 2022-09-27 ENCOUNTER — Ambulatory Visit: Payer: Medicaid Other

## 2022-09-27 DIAGNOSIS — R252 Cramp and spasm: Secondary | ICD-10-CM

## 2022-09-27 DIAGNOSIS — M5441 Lumbago with sciatica, right side: Secondary | ICD-10-CM | POA: Diagnosis not present

## 2022-09-27 DIAGNOSIS — M6281 Muscle weakness (generalized): Secondary | ICD-10-CM

## 2022-09-27 NOTE — Therapy (Signed)
OUTPATIENT PHYSICAL THERAPY TREATMENT   Patient Name: Carly Cabrera MRN: [id number] DOB:December 18, 1985, 36 y.o., female Today's Date: 09/27/2022   PT End of Session - 09/27/22 1100     Visit Number 2    Number of Visits 8    Date for PT Re-Evaluation 11/15/22    Authorization Type [employer] pending authorization    PT Start Time 1013    PT Stop Time 1100    PT Time Calculation (min) 47 min    Activity Tolerance Patient tolerated treatment well    Behavior During Therapy [place] for tasks assessed/performed              Past Medical History:  Diagnosis Date   Gestational diabetes    Gestational diabetes mellitus in pregnancy    Hypertension    Past Surgical History:  Procedure Laterality Date   NO PAST SURGERIES     Patient Active Problem List   Diagnosis Date Noted   Lumbar radiculopathy 07/16/2022   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   History of gestational diabetes 07/28/2015    PCP: [facility]  REFERRING PROVIDER: Rosemarie Ax., MD  REFERRING DIAG: M54.16 (ICD-10-CM) - Lumbar radiculopathy  Rationale for Evaluation and Treatment Rehabilitation  THERAPY DIAG:  Acute midline low back pain with right-sided sciatica  Cramp and spasm  Muscle weakness (generalized)  ONSET DATE: July 2023  SUBJECTIVE:                                                                                                                                                                                           SUBJECTIVE STATEMENT:   Not having much pain now.   PERTINENT HISTORY:  5 children ages 74- 82 years  PAIN:  Are you having pain? Yes: NPRS scale: 7/10 Pain location: midline low back, worst 10/10 Pain description: aches, sometimes sharp Aggravating factors: sleeping, laying down, walking long distances, prolonged sitting and standing Relieving factors: pain medicine   PRECAUTIONS: None  WEIGHT BEARING RESTRICTIONS No  FALLS:  Has  patient fallen in last 6 months? No  LIVING ENVIRONMENT: Lives with: lives with their family Lives in: House/apartment Stairs: Yes: External: 7 steps; on right going up Has following equipment at home: None  OCCUPATION: part time work, Astronomer  PLOF: Independent  PATIENT GOALS decrease pain   OBJECTIVE:   DIAGNOSTIC FINDINGS:  Imaging 08/16/22 DG lumbar spine  FINDINGS: There is no evidence of lumbar spine fracture. Alignment is normal. Intervertebral disc spaces are maintained.   IMPRESSION: Negative.  PATIENT SURVEYS:  Modified Oswestry 23/50 = 46% severe disability  FOTO 33% ability  SCREENING FOR RED FLAGS: Bowel or bladder incontinence: No Spinal tumors: No Cauda equina syndrome: No Compression fracture: No Abdominal aneurysm: No  COGNITION:  Overall cognitive status: Within functional limits for tasks assessed     SENSATION: WFL  MUSCLE LENGTH: NT due to pain  POSTURE: increased lumbar lordosis  PALPATION: Diffuse tenderness throughout lumbar parasinals and glutes.    LUMBAR ROM:   Active  A/PROM  eval  Flexion Limited 90% by pain  Extension Limited 90% by pain  Right lateral flexion Limited 90% by pain  Left lateral flexion Limited 90% by pain  Right rotation Limited 90% by pain  Left rotation Limited 90% by pain   (Blank rows = not tested)  LOWER EXTREMITY ROM:   NT due to pain.   Passive  Right eval Left eval  Hip flexion    Hip extension    Hip abduction    Hip adduction    Hip internal rotation    Hip external rotation    Knee flexion    Knee extension    Ankle dorsiflexion    Ankle plantarflexion    Ankle inversion    Ankle eversion     (Blank rows = not tested)  LOWER EXTREMITY MMT:    MMT Right* eval Left* eval  Hip flexion 3+ * 4 *  Hip extension    Hip abduction 4* 4*  Hip adduction 3+* 4*  Knee flexion 3+ 4  Knee extension 3+ 4  Ankle dorsiflexion 3+ 4  Ankle plantarflexion 3 4   (Blank rows = not tested)  * all LE MMT increased LB pain  LUMBAR SPECIAL TESTS:  Immediate report of pain with all movement, for example picking up heel for SLR increased pain bil at 5 deg.  Unable to get into position for FABER or other special tests.   FUNCTIONAL TESTS:  5 times sit to stand: could not complete.  Only 2 STS in 20 seconds with 1 UE assist and increased pain.   GAIT: Distance walked: 53' Assistive device utilized: None Level of assistance: Complete Independence Comments: visually slow gait speed    TODAY'S TREATMENT  09/27/22 TherEx: Prone HS curls 2 x 10 bil POE 2x for 5 sec holds - 1x10 sec holds Quadruped cat/cow - unable d/t pain Nustep L2x5min Standing side bends R/L x 10 Seated rollout with green yoga ball x 10 lumbar flexion Manual Therapy: IASTM to bil lumbar and sacral paraspinals with foam roll  8 min moist heat with prone exercises  09/16/2022 Modalities: Estim (IFC) + MHP to L/S (not charged)    PATIENT EDUCATION:  Education details: education on findings and POC Person educated: Patient Education method: Explanation Education comprehension: verbalized understanding   HOME EXERCISE PROGRAM: Access Code: [email or web address] URL: [email or web address] Date: 09/27/2022 Prepared by: Clarene Essex  Exercises - Prone Press Up On Elbows  - 1 x daily - 7 x weekly - 3 sets - 5 reps - 5 -10 sec hold - Standing Sidebends  - 1 x daily - 7 x weekly - 3 sets - 10 reps  ASSESSMENT:  CLINICAL IMPRESSION: Patient was still limited today with most interventions by LBP. She was able to get into prone position with MHP, did fair with knee bends and POE but with small ROM. Initially she was limited by pain with recumbent bike, mid session she was able to try Nustep with moist heat. Today we only added POE and standing side bends as these exercises seemed to cause the least  pain. Will hopefully be able to progress more lumbopelvic mobility next visit.   OBJECTIVE IMPAIRMENTS  decreased endurance, decreased mobility, difficulty walking, decreased ROM, decreased strength, hypomobility, increased fascial restrictions, impaired perceived functional ability, increased muscle spasms, impaired flexibility, postural dysfunction, and pain.   ACTIVITY LIMITATIONS carrying, lifting, bending, sitting, standing, squatting, sleeping, stairs, transfers, bed mobility, locomotion level, and caring for others  PARTICIPATION LIMITATIONS: meal prep, cleaning, laundry, driving, shopping, community activity, and occupation  PERSONAL FACTORS Time since onset of injury/illness/exacerbation are also affecting patient's functional outcome.   REHAB POTENTIAL: Good  CLINICAL DECISION MAKING: Stable/uncomplicated  EVALUATION COMPLEXITY: Low   GOALS: Goals reviewed with patient? Yes  SHORT TERM GOALS: Target date: 09/30/2022   Patient will be independent with initial HEP.  Baseline:  Goal status: IN PROGRESS  2.  Patient will demonstrate 4+/5 bil LE strength for safety with driving.   Baseline: see objective Goal status: IN PROGRESS   LONG TERM GOALS: Target date: 10/28/2022    Patient will be independent with advanced/ongoing HEP to improve outcomes and carryover.  Baseline:  Goal status: IN PROGRESS  2.  Patient will report 75% improvement in low back pain to improve QOL.  Baseline: 6-10/10 Goal status: IN PROGRESS  3.  Patient will demonstrate full pain free lumbar ROM to perform ADLs.   Baseline: see objective Goal status: IN PROGRESS  4.  Patient will demonstrate improved functional strength as demonstrated by 5x STS < 30 sec. Baseline: 2 sit to stands in 20 seconds, unable to continue Goal status: IN PROGRESS  5.  Patient will report 52% on lumbar FOTO to demonstrate improved functional ability.  Baseline: 33% Goal status: IN PROGRESS   6.  Patient will tolerate 1 hour min of standing to perform job activities without increased LBP. Baseline:  Goal status: IN  PROGRESS   PLAN: PT FREQUENCY: 1x/week  PT DURATION: 8 weeks  PLANNED INTERVENTIONS: Therapeutic exercises, Therapeutic activity, Neuromuscular re-education, Balance training, Gait training, Patient/Family education, Self Care, Joint mobilization, Stair training, Dry Needling, Electrical stimulation, Spinal mobilization, Cryotherapy, Moist heat, Traction, Ultrasound, Manual therapy, and Re-evaluation.  PLAN FOR NEXT SESSION: try MHP at beginning to see if will tolerate supine exercises, prone exercises more tolerable; lumbar mobility as tolerated; give info on TENS   Jaliyah Fotheringham L Seema Blum, PTA 09/27/2022, 11:01 AM

## 2022-09-30 ENCOUNTER — Encounter: Payer: Medicaid Other | Admitting: Physical Therapy

## 2022-10-04 ENCOUNTER — Ambulatory Visit: Payer: Medicaid Other

## 2022-10-11 ENCOUNTER — Ambulatory Visit: Payer: Medicaid Other | Admitting: Physical Therapy

## 2022-10-18 ENCOUNTER — Ambulatory Visit: Payer: Medicaid Other | Attending: Family Medicine | Admitting: Physical Therapy

## 2022-10-18 ENCOUNTER — Encounter: Payer: Self-pay | Admitting: Physical Therapy

## 2022-10-18 DIAGNOSIS — R252 Cramp and spasm: Secondary | ICD-10-CM | POA: Diagnosis present

## 2022-10-18 DIAGNOSIS — M5441 Lumbago with sciatica, right side: Secondary | ICD-10-CM | POA: Insufficient documentation

## 2022-10-18 DIAGNOSIS — M6281 Muscle weakness (generalized): Secondary | ICD-10-CM | POA: Insufficient documentation

## 2022-10-18 NOTE — Therapy (Signed)
OUTPATIENT PHYSICAL THERAPY TREATMENT   Patient Name: Shavelle Runkel MRN: [id number] DOB:January 15, 1986, 36 y.o., female Today's Date: 10/18/2022   PT End of Session - 10/18/22 0802     Visit Number 3    Number of Visits 8    Date for PT Re-Evaluation 11/15/22    Authorization Type [employer] [facility] Medicaid    Authorization Time Period 09/20/22-11/08/22    Authorization - Visit Number 2    Authorization - Number of Visits 9    PT Start Time 0802    PT Stop Time 2951    PT Time Calculation (min) 41 min    Activity Tolerance Patient tolerated treatment well    Behavior During Therapy [facility] for tasks assessed/performed              Past Medical History:  Diagnosis Date   Gestational diabetes    Gestational diabetes mellitus in pregnancy    Hypertension    Past Surgical History:  Procedure Laterality Date   NO PAST SURGERIES     Patient Active Problem List   Diagnosis Date Noted   Lumbar radiculopathy 07/16/2022   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   History of gestational diabetes 07/28/2015    PCP: [facility]  REFERRING PROVIDER: Rosemarie Ax., MD  REFERRING DIAG: M54.16 (ICD-10-CM) - Lumbar radiculopathy  Rationale for Evaluation and Treatment Rehabilitation  THERAPY DIAG:  Acute midline low back pain with right-sided sciatica  Cramp and spasm  Muscle weakness (generalized)  ONSET DATE: July 2023  SUBJECTIVE:                                                                                                                                                                                           SUBJECTIVE STATEMENT:   Nielle Duford reports back has calmed down, not like it was before, but the pain comes and goes, last night took pain medication, but this morning does not have pain.   PERTINENT HISTORY:  5 children ages 30- 66 years  PAIN:  Are you having pain? Yes: NPRS scale: 7/10 Pain location: midline low back, worst 10/10 Pain  description: aches, sometimes sharp Aggravating factors: sleeping, laying down, walking long distances, prolonged sitting and standing Relieving factors: pain medicine   PRECAUTIONS: None  WEIGHT BEARING RESTRICTIONS No  FALLS:  Has patient fallen in last 6 months? No  LIVING ENVIRONMENT: Lives with: lives with their family Lives in: House/apartment Stairs: Yes: External: 7 steps; on right going up Has following equipment at home: None  OCCUPATION: part time work, Astronomer  PLOF: Independent  PATIENT GOALS decrease pain   OBJECTIVE:  DIAGNOSTIC FINDINGS:  Imaging 08/16/22 DG lumbar spine  FINDINGS: There is no evidence of lumbar spine fracture. Alignment is normal. Intervertebral disc spaces are maintained.   IMPRESSION: Negative.  PATIENT SURVEYS:  Modified Oswestry 23/50 = 46% severe disability  FOTO 33% ability   SCREENING FOR RED FLAGS: Bowel or bladder incontinence: No Spinal tumors: No Cauda equina syndrome: No Compression fracture: No Abdominal aneurysm: No  COGNITION:  Overall cognitive status: Within functional limits for tasks assessed     SENSATION: WFL  MUSCLE LENGTH: NT due to pain  POSTURE: increased lumbar lordosis  PALPATION: Diffuse tenderness throughout lumbar parasinals and glutes.    LUMBAR ROM:   Active  A/PROM  eval  Flexion Limited 90% by pain  Extension Limited 90% by pain  Right lateral flexion Limited 90% by pain  Left lateral flexion Limited 90% by pain  Right rotation Limited 90% by pain  Left rotation Limited 90% by pain   (Blank rows = not tested)  LOWER EXTREMITY ROM:   NT due to pain.   Passive  Right eval Left eval  Hip flexion    Hip extension    Hip abduction    Hip adduction    Hip internal rotation    Hip external rotation    Knee flexion    Knee extension    Ankle dorsiflexion    Ankle plantarflexion    Ankle inversion    Ankle eversion     (Blank rows = not tested)  LOWER EXTREMITY MMT:     MMT Right* eval Left* eval  Hip flexion 3+ * 4 *  Hip extension    Hip abduction 4* 4*  Hip adduction 3+* 4*  Knee flexion 3+ 4  Knee extension 3+ 4  Ankle dorsiflexion 3+ 4  Ankle plantarflexion 3 4   (Blank rows = not tested) * all LE MMT increased LB pain  LUMBAR SPECIAL TESTS:  Immediate report of pain with all movement, for example picking up heel for SLR increased pain bil at 5 deg.  Unable to get into position for FABER or other special tests.   FUNCTIONAL TESTS:  5 times sit to stand: could not complete.  Only 2 STS in 20 seconds with 1 UE assist and increased pain.   GAIT: Distance walked: 70' Assistive device utilized: None Level of assistance: Complete Independence Comments: visually slow gait speed    TODAY'S TREATMENT  10/18/2022 Therapeutic Exercise: to improve strength and mobility.  Demo, verbal and tactile cues throughout for technique. Bike L1 x 5 min  Standing extension x 10  Standing side glide (L side to wall) x 10  Supine PPT x 10 LTR x 10  Bridge with PPT- unable sharp pain Prone lying x 1 min Prone press-ups x 10 - decreased pain Prone knee bends x 10 bil  Prone hip extension - unable due to spasm, press-ups x 10 again relieved pain.  Manual Therapy: to decrease muscle spasm and pain and improve mobility STM/TPR to lumbar paraspinals, R UPA mobs lumbar spine, IASTM with foam roller to bil glutes and QL     09/27/22 TherEx: Prone HS curls 2 x 10 bil POE 2x for 5 sec holds - 1x10 sec holds Quadruped cat/cow - unable d/t pain Nustep L2x14mn Standing side bends R/L x 10 Seated rollout with green yoga ball x 10 lumbar flexion Manual Therapy: IASTM to bil lumbar and sacral paraspinals with foam roll  8 min moist heat with prone exercises  09/16/2022 Modalities:  Estim (IFC) + MHP to L/S (not charged)    PATIENT EDUCATION:  Education details: HEP update Person educated: Patient Education method: Explanation, Demonstration, Verbal cues,  and Handouts Education comprehension: verbalized understanding and returned demonstration   HOME EXERCISE PROGRAM: Access Code: [email or web address] URL: [email or web address] Date: 10/18/2022 Prepared by: Glenetta Hew  Exercises - Prone Press Up On Elbows  - 1 x daily - 7 x weekly - 3 sets - 5 reps - 5 -10 sec hold - Lateral Shift Correction at Wall  - 3 x daily - 7 x weekly - 1 sets - 10 reps - Standing Lumbar Extension at [place]  - 3 x daily - 7 x weekly - 1 sets - 10 reps - Supine Lower Trunk Rotation  - 1 x daily - 7 x weekly - 2 sets - 10 reps - Supine Posterior Pelvic Tilt  - 1 x daily - 7 x weekly - 2 sets - 10 reps - Seated Posterior Pelvic Tilt  - 1 x daily - 7 x weekly - 2 sets - 10 reps  ASSESSMENT:  CLINICAL IMPRESSION: Maniyah Moller reported no pain on arrival, but still demonstrates significant irritation of lumbar paraspinals, reporting increasing pain with bike and also sharp pain with trying to bridge or extend leg in prone.  Prone press-ups continue to decrease  pain.  Started neutral spine exercise with PPT to strengthen TrA, tolerated well.  She had significant spasm in lumbar paraspinals especially on right, discussed dry needling as a good option to improve reactivity for next session.  Annalycia Done continues to demonstrate potential for improvement and would benefit from continued skilled therapy to address impairments.      OBJECTIVE IMPAIRMENTS decreased endurance, decreased mobility, difficulty walking, decreased ROM, decreased strength, hypomobility, increased fascial restrictions, impaired perceived functional ability, increased muscle spasms, impaired flexibility, postural dysfunction, and pain.   ACTIVITY LIMITATIONS carrying, lifting, bending, sitting, standing, squatting, sleeping, stairs, transfers, bed mobility, locomotion level, and caring for others  PARTICIPATION LIMITATIONS: meal prep, cleaning, laundry, driving, shopping,  community activity, and occupation  PERSONAL FACTORS Time since onset of injury/illness/exacerbation are also affecting patient's functional outcome.   REHAB POTENTIAL: Good  CLINICAL DECISION MAKING: Stable/uncomplicated  EVALUATION COMPLEXITY: Low   GOALS: Goals reviewed with patient? Yes  SHORT TERM GOALS: Target date: 09/30/2022   Patient will be independent with initial HEP.  Baseline:  Goal status: MET 10/18/22  2.  Patient will demonstrate 4+/5 bil LE strength for safety with driving.   Baseline: see objective Goal status: IN PROGRESS   LONG TERM GOALS: Target date: 10/28/2022    Patient will be independent with advanced/ongoing HEP to improve outcomes and carryover.  Baseline:  Goal status: IN PROGRESS  2.  Patient will report 75% improvement in low back pain to improve QOL.  Baseline: 6-10/10 Goal status: IN PROGRESS  3.  Patient will demonstrate full pain free lumbar ROM to perform ADLs.   Baseline: see objective Goal status: IN PROGRESS  4.  Patient will demonstrate improved functional strength as demonstrated by 5x STS < 30 sec. Baseline: 2 sit to stands in 20 seconds, unable to continue Goal status: IN PROGRESS  5.  Patient will report 52% on lumbar FOTO to demonstrate improved functional ability.  Baseline: 33% Goal status: IN PROGRESS   6.  Patient will tolerate 1 hour min of standing to perform job activities without increased LBP. Baseline:  Goal status: IN PROGRESS   PLAN: PT FREQUENCY: 1x/week  PT DURATION:  8 weeks  PLANNED INTERVENTIONS: Therapeutic exercises, Therapeutic activity, Neuromuscular re-education, Balance training, Gait training, Patient/Family education, Self Care, Joint mobilization, Stair training, Dry Needling, Electrical stimulation, Spinal mobilization, Cryotherapy, Moist heat, Traction, Ultrasound, Manual therapy, and Re-evaluation.  PLAN FOR NEXT SESSION: given info TENS and DN, continue to progress exercises as  tolerated.    Rennie Natter, PT, DPT  10/18/2022, 8:48 AM

## 2022-10-25 ENCOUNTER — Ambulatory Visit: Payer: Medicaid Other | Admitting: Physical Therapy

## 2022-10-25 DIAGNOSIS — M6281 Muscle weakness (generalized): Secondary | ICD-10-CM

## 2022-10-25 DIAGNOSIS — R252 Cramp and spasm: Secondary | ICD-10-CM

## 2022-10-25 DIAGNOSIS — M5441 Lumbago with sciatica, right side: Secondary | ICD-10-CM

## 2022-10-25 NOTE — Therapy (Signed)
OUTPATIENT PHYSICAL THERAPY TREATMENT   Patient Name: Geraldin Habermehl MRN: [id number] DOB:1985-12-17, 36 y.o., female Today's Date: 10/25/2022   PT End of Session - 10/25/22 0809     Visit Number 4    Number of Visits 8    Date for PT Re-Evaluation 11/15/22    Authorization Type [facility] Medicaid    Authorization Time Period 09/20/22-11/08/22    Authorization - Visit Number 3    Authorization - Number of Visits 9    PT Start Time 0806    PT Stop Time 0845    PT Time Calculation (min) 39 min    Activity Tolerance Patient tolerated treatment well    Behavior During Therapy [facility] for tasks assessed/performed              Past Medical History:  Diagnosis Date   Gestational diabetes    Gestational diabetes mellitus in pregnancy    Hypertension    Past Surgical History:  Procedure Laterality Date   NO PAST SURGERIES     Patient Active Problem List   Diagnosis Date Noted   Lumbar radiculopathy 07/16/2022   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   History of gestational diabetes 07/28/2015    PCP: [facility]  REFERRING PROVIDER: Rosemarie Ax., MD  REFERRING DIAG: M54.16 (ICD-10-CM) - Lumbar radiculopathy  Rationale for Evaluation and Treatment Rehabilitation  THERAPY DIAG:  Acute midline low back pain with right-sided sciatica  Cramp and spasm  Muscle weakness (generalized)  ONSET DATE: July 2023  SUBJECTIVE:                                                                                                                                                                                           SUBJECTIVE STATEMENT:   Polly Keast reports pain is better, no pain this morning.   PERTINENT HISTORY:  5 children ages 76- 51 years  PAIN:  Are you having pain? Yes: NPRS scale: 0/10 Pain location: midline low back, worst 10/10 Pain description: aches, sometimes sharp Aggravating factors: sleeping, laying down, walking long distances,  prolonged sitting and standing Relieving factors: pain medicine   PRECAUTIONS: None  WEIGHT BEARING RESTRICTIONS No  FALLS:  Has patient fallen in last 6 months? No  LIVING ENVIRONMENT: Lives with: lives with their family Lives in: House/apartment Stairs: Yes: External: 7 steps; on right going up Has following equipment at home: None  OCCUPATION: part time work, Astronomer  PLOF: Independent  PATIENT GOALS decrease pain   OBJECTIVE:   DIAGNOSTIC FINDINGS:  Imaging 08/16/22 DG lumbar spine  FINDINGS: There is no evidence of lumbar spine fracture. Alignment is  normal. Intervertebral disc spaces are maintained.   IMPRESSION: Negative.  PATIENT SURVEYS:  Modified Oswestry 23/50 = 46% severe disability  FOTO 33% ability   SCREENING FOR RED FLAGS: Bowel or bladder incontinence: No Spinal tumors: No Cauda equina syndrome: No Compression fracture: No Abdominal aneurysm: No  COGNITION:  Overall cognitive status: Within functional limits for tasks assessed     SENSATION: WFL  MUSCLE LENGTH: NT due to pain  POSTURE: increased lumbar lordosis  PALPATION: Diffuse tenderness throughout lumbar parasinals and glutes.    LUMBAR ROM:   Active  A/PROM  eval  Flexion Limited 90% by pain  Extension Limited 90% by pain  Right lateral flexion Limited 90% by pain  Left lateral flexion Limited 90% by pain  Right rotation Limited 90% by pain  Left rotation Limited 90% by pain   (Blank rows = not tested)  LOWER EXTREMITY ROM:   NT due to pain.   Passive  Right eval Left eval  Hip flexion    Hip extension    Hip abduction    Hip adduction    Hip internal rotation    Hip external rotation    Knee flexion    Knee extension    Ankle dorsiflexion    Ankle plantarflexion    Ankle inversion    Ankle eversion     (Blank rows = not tested)  LOWER EXTREMITY MMT:    MMT Right* eval Left* eval  Hip flexion 3+ * 4 *  Hip extension    Hip abduction 4* 4*  Hip  adduction 3+* 4*  Knee flexion 3+ 4  Knee extension 3+ 4  Ankle dorsiflexion 3+ 4  Ankle plantarflexion 3 4   (Blank rows = not tested) * all LE MMT increased LB pain  LUMBAR SPECIAL TESTS:  Immediate report of pain with all movement, for example picking up heel for SLR increased pain bil at 5 deg.  Unable to get into position for FABER or other special tests.   FUNCTIONAL TESTS:  5 times sit to stand: could not complete.  Only 2 STS in 20 seconds with 1 UE assist and increased pain.   GAIT: Distance walked: 70' Assistive device utilized: None Level of assistance: Complete Independence Comments: visually slow gait speed    TODAY'S TREATMENT  10/25/2022 Therapeutic Exercise: to improve strength and mobility.  Demo, verbal and tactile cues throughout for technique. Nustep L6 x 6 min  PPT 3 x 10 sec hold Bridges with TrA contraction x 10 - very small amplitude Supine clam with TrA - RTB x 10 small amplitude, tried increasing amplitude before muscle spasmed. LTR 2 x 30 sec hold for stretch Manual Therapy: to decrease muscle spasm and pain and improve mobility IASTM with foam roller to bil lumbar paraspinals and glutes, MFR to bil QL, STM/TPR to bil lumbar paraspinals, PA mobs L5/S1 grade 2-3   10/18/2022 Therapeutic Exercise: to improve strength and mobility.  Demo, verbal and tactile cues throughout for technique. Bike L1 x 5 min  Standing extension x 10  Standing side glide (L side to wall) x 10  Supine PPT x 10 LTR x 10  Bridge with PPT- unable sharp pain Prone lying x 1 min Prone press-ups x 10 - decreased pain Prone knee bends x 10 bil  Prone hip extension - unable due to spasm, press-ups x 10 again relieved pain.  Manual Therapy: to decrease muscle spasm and pain and improve mobility STM/TPR to lumbar paraspinals, R UPA mobs lumbar  spine, IASTM with foam roller to bil glutes and QL     09/27/22 TherEx: Prone HS curls 2 x 10 bil POE 2x for 5 sec holds - 1x10 sec  holds Quadruped cat/cow - unable d/t pain Nustep L2x70mn Standing side bends R/L x 10 Seated rollout with green yoga ball x 10 lumbar flexion Manual Therapy: IASTM to bil lumbar and sacral paraspinals with foam roll  8 min moist heat with prone exercises  09/16/2022 Modalities: Estim (IFC) + MHP to L/S (not charged)    PATIENT EDUCATION:  Education details: HEP update Person educated: Patient Education method: EConsulting civil engineer DMedia planner Verbal cues, and Handouts Education comprehension: verbalized understanding and returned demonstration   HOME EXERCISE PROGRAM: Access Code: [email or web address] URL: [email or web address] Date: 10/18/2022 Prepared by: EGlenetta Hew Exercises - Prone Press Up On Elbows  - 1 x daily - 7 x weekly - 3 sets - 5 reps - 5 -10 sec hold - Lateral Shift Correction at Wall  - 3 x daily - 7 x weekly - 1 sets - 10 reps - Standing Lumbar [place] - 3 x daily - 7 x weekly - 1 sets - 10 reps - Supine Lower Trunk Rotation  - 1 x daily - 7 x weekly - 2 sets - 10 reps - Supine Posterior Pelvic Tilt  - 1 x daily - 7 x weekly - 2 sets - 10 reps - Seated Posterior Pelvic Tilt  - 1 x daily - 7 x weekly - 2 sets - 10 reps  ASSESSMENT:  CLINICAL IMPRESSION: MAngelica Frandsenreported no pain on arrival, but still demonstrates significant irritation of lumbar paraspinals, reporting spasm with supine clams when attempted larger amplitude movement.  She was able to do bridges today, again very small amplitude to avoid trigger pain.  Discussed dry needling again but she did not feel comfortable.  Reported decreased pain following manual therapy.  She reports she feels she isn't making progress because she is not getting any sleep at home - her son has autism and only sleeps for an hour at hight before waking her up.   MDayle Sherpacontinues to demonstrate potential for improvement and would benefit from continued skilled therapy to address  impairments.      OBJECTIVE IMPAIRMENTS decreased endurance, decreased mobility, difficulty walking, decreased ROM, decreased strength, hypomobility, increased fascial restrictions, impaired perceived functional ability, increased muscle spasms, impaired flexibility, postural dysfunction, and pain.   ACTIVITY LIMITATIONS carrying, lifting, bending, sitting, standing, squatting, sleeping, stairs, transfers, bed mobility, locomotion level, and caring for others  PARTICIPATION LIMITATIONS: meal prep, cleaning, laundry, driving, shopping, community activity, and occupation  PERSONAL FACTORS Time since onset of injury/illness/exacerbation are also affecting patient's functional outcome.   REHAB POTENTIAL: Good  CLINICAL DECISION MAKING: Stable/uncomplicated  EVALUATION COMPLEXITY: Low   GOALS: Goals reviewed with patient? Yes  SHORT TERM GOALS: Target date: 09/30/2022   Patient will be independent with initial HEP.  Baseline:  Goal status: MET 10/18/22  2.  Patient will demonstrate 4+/5 bil LE strength for safety with driving.   Baseline: see objective Goal status: IN PROGRESS   LONG TERM GOALS: Target date: 10/28/2022    Patient will be independent with advanced/ongoing HEP to improve outcomes and carryover.  Baseline:  Goal status: IN PROGRESS  2.  Patient will report 75% improvement in low back pain to improve QOL.  Baseline: 6-10/10 Goal status: IN PROGRESS  3.  Patient will demonstrate full pain free lumbar ROM to perform  ADLs.   Baseline: see objective Goal status: IN PROGRESS  4.  Patient will demonstrate improved functional strength as demonstrated by 5x STS < 30 sec. Baseline: 2 sit to stands in 20 seconds, unable to continue Goal status: IN PROGRESS  5.  Patient will report 52% on lumbar FOTO to demonstrate improved functional ability.  Baseline: 33% Goal status: IN PROGRESS   6.  Patient will tolerate 1 hour min of standing to perform job activities  without increased LBP. Baseline:  Goal status: IN PROGRESS   PLAN: PT FREQUENCY: 1x/week  PT DURATION: 8 weeks  PLANNED INTERVENTIONS: Therapeutic exercises, Therapeutic activity, Neuromuscular re-education, Balance training, Gait training, Patient/Family education, Self Care, Joint mobilization, Stair training, Dry Needling, Electrical stimulation, Spinal mobilization, Cryotherapy, Moist heat, Traction, Ultrasound, Manual therapy, and Re-evaluation.  PLAN FOR NEXT SESSION: given info TENS and DN, continue to progress exercises as tolerated.    Rennie Natter, PT, DPT  10/25/2022, 8:55 AM

## 2022-11-08 ENCOUNTER — Ambulatory Visit: Payer: Medicaid Other

## 2022-11-15 ENCOUNTER — Ambulatory Visit: Payer: Medicaid Other | Attending: Family Medicine | Admitting: Physical Therapy

## 2022-11-15 ENCOUNTER — Encounter: Payer: Self-pay | Admitting: Physical Therapy

## 2022-11-15 DIAGNOSIS — M6281 Muscle weakness (generalized): Secondary | ICD-10-CM | POA: Diagnosis present

## 2022-11-15 DIAGNOSIS — M5441 Lumbago with sciatica, right side: Secondary | ICD-10-CM | POA: Diagnosis present

## 2022-11-15 DIAGNOSIS — R252 Cramp and spasm: Secondary | ICD-10-CM | POA: Diagnosis present

## 2022-11-15 NOTE — Therapy (Addendum)
OUTPATIENT PHYSICAL THERAPY TREATMENT Progress Note Reporting Period 09/16/2022 to 11/15/2022  See note below for Objective Data and Assessment of Progress/Goals.     Patient Name: Carly Cabrera MRN: [id number] DOB:27-Feb-1986, 36 y.o., female Today's Date: 11/15/2022   PT End of Session - 11/15/22 0808     Visit Number 5    Number of Visits 8    Date for PT Re-Evaluation 11/15/22    Authorization Type [facility] Medicaid    Authorization Time Period 09/20/22-11/18/22    Authorization - Number of Visits 9    PT Start Time 0805    PT Stop Time 0845    PT Time Calculation (min) 40 min    Activity Tolerance Patient tolerated treatment well    Behavior During Therapy [facility] for tasks assessed/performed              Past Medical History:  Diagnosis Date   Gestational diabetes    Gestational diabetes mellitus in pregnancy    Hypertension    Past Surgical History:  Procedure Laterality Date   NO PAST SURGERIES     Patient Active Problem List   Diagnosis Date Noted   Lumbar radiculopathy 07/16/2022   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   History of gestational diabetes 07/28/2015    PCP: [facility]  REFERRING PROVIDER: Rosemarie Ax., MD  REFERRING DIAG: M54.16 (ICD-10-CM) - Lumbar radiculopathy  Rationale for Evaluation and Treatment Rehabilitation  THERAPY DIAG:  Acute midline low back pain with right-sided sciatica  Cramp and spasm  Muscle weakness (generalized)  ONSET DATE: July 2023  SUBJECTIVE:                                                                                                                                                                                           SUBJECTIVE STATEMENT:   Carly Cabrera reports no pain this morning.  She has more pain in mornings that she doesn't get enough rest.   PERTINENT HISTORY:  5 children ages 32- 38 years  PAIN:  Are you having pain? Yes: NPRS scale: 0/10 Pain location:  midline low back, worst 10/10 Pain description: aches, sometimes sharp Aggravating factors: sleeping, laying down, walking long distances, prolonged sitting and standing Relieving factors: pain medicine   PRECAUTIONS: None  WEIGHT BEARING RESTRICTIONS No  FALLS:  Has patient fallen in last 6 months? No  LIVING ENVIRONMENT: Lives with: lives with their family Lives in: House/apartment Stairs: Yes: External: 7 steps; on right going up Has following equipment at home: None  OCCUPATION: part time work, Astronomer  PLOF: Independent  PATIENT GOALS decrease pain   OBJECTIVE:  DIAGNOSTIC FINDINGS:  Imaging 08/16/22 DG lumbar spine  FINDINGS: There is no evidence of lumbar spine fracture. Alignment is normal. Intervertebral disc spaces are maintained.   IMPRESSION: Negative.  PATIENT SURVEYS:  Modified Oswestry 23/50 = 46% severe disability  FOTO 33% ability   SCREENING FOR RED FLAGS: Bowel or bladder incontinence: No Spinal tumors: No Cauda equina syndrome: No Compression fracture: No Abdominal aneurysm: No  COGNITION:  Overall cognitive status: Within functional limits for tasks assessed     SENSATION: WFL  MUSCLE LENGTH: NT due to pain  POSTURE: increased lumbar lordosis  PALPATION: Diffuse tenderness throughout lumbar parasinals and glutes.    LUMBAR ROM:   Active  A/PROM  eval AROM 11/15/22  Flexion Limited 90% by pain To shins but then sharp pain  Extension Limited 90% by pain Limited 50% by pain  Right lateral flexion Limited 90% by pain   Left lateral flexion Limited 90% by pain   Right rotation Limited 90% by pain   Left rotation Limited 90% by pain    (Blank rows = not tested)  LOWER EXTREMITY ROM:   NT due to pain.   LOWER EXTREMITY MMT:    MMT Right* eval Left* eval Right 11/15/22 Left  11/15/22  Hip flexion 3+ * 4 * 4+ 4+  Hip extension   4+ 4+  Hip abduction 4* 4* 4+ 4+  Hip adduction 3+* 4* 5 5  Knee flexion 3+ 4 4+ 4+  Knee  extension 3+ _0 Ankle dorsiflexion 3+ _1 Ankle plantarflexion _2 (Blank rows = not tested) * all LE MMT increased LB pain  LUMBAR SPECIAL TESTS:  Immediate report of pain with all movement, for example picking up heel for SLR increased pain bil at 5 deg.  Unable to get into position for FABER or other special tests.   FUNCTIONAL TESTS:  5 times sit to stand: could not complete.  Only 2 STS in 20 seconds with 1 UE assist and increased pain.   GAIT: Distance walked: 79' Assistive device utilized: None Level of assistance: Complete Independence Comments: visually slow gait speed    TODAY'S TREATMENT  11/15/2022 Therapeutic Activity:  to assess progress towards goals.   Bike L1 x 5 min warm-up and subjective MMT 5xSTS Lumbar AROM FOTO Review of HEP Manual Therapy: to decrease muscle spasm and pain and improve mobility IASTM with foam roller to bil lumbar paraspinals and glutes, MFR to bil QL, STM/TPR to bil lumbar paraspinals, PA mobs L5/S1 grade 2-3  10/25/2022 Therapeutic Exercise: to improve strength and mobility.  Demo, verbal and tactile cues throughout for technique. Nustep L6 x 6 min  PPT 3 x 10 sec hold Bridges with TrA contraction x 10 - very small amplitude Supine clam with TrA - RTB x 10 small amplitude, tried increasing amplitude before muscle spasmed. LTR 2 x 30 sec hold for stretch Manual Therapy: to decrease muscle spasm and pain and improve mobility IASTM with foam roller to bil lumbar paraspinals and glutes, MFR to bil QL, STM/TPR to bil lumbar paraspinals, PA mobs L5/S1 grade 2-3   10/18/2022 Therapeutic Exercise: to improve strength and mobility.  Demo, verbal and tactile cues throughout for technique. Bike L1 x 5 min  Standing extension x 10  Standing side glide (L side to wall) x 10  Supine PPT x 10 LTR x 10  Bridge with PPT- unable sharp pain Prone lying x 1 min Prone press-ups x 10 -  decreased pain Prone knee bends x 10 bil  Prone hip  extension - unable due to spasm, press-ups x 10 again relieved pain.  Manual Therapy: to decrease muscle spasm and pain and improve mobility STM/TPR to lumbar paraspinals, R UPA mobs lumbar spine, IASTM with foam roller to bil glutes and QL   PATIENT EDUCATION:  Education details: continue HEP as tolerated Person educated: Patient Education method: Explanation Education comprehension: verbalized understanding   HOME EXERCISE PROGRAM: Access Code: [email or web address] URL: [email or web address] Date: 10/18/2022 Prepared by: Glenetta Hew  Exercises - Prone Press Up On Elbows  - 1 x daily - 7 x weekly - 3 sets - 5 reps - 5 -10 sec hold - Lateral Shift Correction at Wall  - 3 x daily - 7 x weekly - 1 sets - 10 reps - Standing Lumbar Extension at [place]  - 3 x daily - 7 x weekly - 1 sets - 10 reps - Supine Lower Trunk Rotation  - 1 x daily - 7 x weekly - 2 sets - 10 reps - Supine Posterior Pelvic Tilt  - 1 x daily - 7 x weekly - 2 sets - 10 reps - Seated Posterior Pelvic Tilt  - 1 x daily - 7 x weekly - 2 sets - 10 reps  ASSESSMENT:  CLINICAL IMPRESSION: Quinta Eimer reports almost 90% improvement overall in LBP, meeting LTG #2, however she still has significant irritability in lumbar paraspinals, reporting sharp pain with lumbar AROM.  Her FOTO has improved to only 39% functional ability, and she reports inability to lift and carry groceries and complete all work activities, and has a lot of difficulty standing at job due to LBP.   Her functional strength has improved, and she has met STG #2, and LTG #4 for LE strength.  She reports good compliance with HEP, meeting LTG #1.  Today focused on manual therapy to relieve muscle spasm after goal assessment, reported decreased pain after interventions and declined modalities.  She would like to be placed on 30 day hold, as her children will be home due to holidays.    OBJECTIVE IMPAIRMENTS decreased endurance, decreased  mobility, difficulty walking, decreased ROM, decreased strength, hypomobility, increased fascial restrictions, impaired perceived functional ability, increased muscle spasms, impaired flexibility, postural dysfunction, and pain.   ACTIVITY LIMITATIONS carrying, lifting, bending, sitting, standing, squatting, sleeping, stairs, transfers, bed mobility, locomotion level, and caring for others  PARTICIPATION LIMITATIONS: meal prep, cleaning, laundry, driving, shopping, community activity, and occupation  PERSONAL FACTORS Time since onset of injury/illness/exacerbation are also affecting patient's functional outcome.   REHAB POTENTIAL: Good  CLINICAL DECISION MAKING: Stable/uncomplicated  EVALUATION COMPLEXITY: Low   GOALS: Goals reviewed with patient? Yes  SHORT TERM GOALS: Target date: 09/30/2022   Patient will be independent with initial HEP.  Baseline:  Goal status: MET 10/18/22  2.  Patient will demonstrate 4+/5 bil LE strength for safety with driving.   Baseline: see objective Goal status: MET 11/15/22.    LONG TERM GOALS: Target date: 10/28/2022    Patient will be independent with advanced/ongoing HEP to improve outcomes and carryover.  Baseline:  Goal status: MET 11/15/22- met for current.   2.  Patient will report 75% improvement in low back pain to improve QOL.  Baseline: 6-10/10 Goal status: MET 11/15/22 reports 90% improvement.   3.  Patient will demonstrate full pain free lumbar ROM to perform ADLs.   Baseline: see objective Goal status: IN PROGRESS 11/15/22- still limited, sharp pain with  flexion and extension at endrange.   4.  Patient will demonstrate improved functional strength as demonstrated by 5x STS < 30 sec. Baseline: 2 sit to stands in 20 seconds, unable to continue Goal status: MET 11/15/22- 5x STS in 26.7 seconds  5.  Patient will report 52% on lumbar FOTO to demonstrate improved functional ability.  Baseline: 33% Goal status: IN PROGRESS 11/15/22-  39%  6.  Patient will tolerate 1 hour min of standing to perform job activities without increased LBP. Baseline:  Goal status: IN PROGRESS 11/15/22- still has increased pain with prolonged standing.    PLAN: PT FREQUENCY: 1x/week  PT DURATION: 8 weeks  PLANNED INTERVENTIONS: Therapeutic exercises, Therapeutic activity, Neuromuscular re-education, Balance training, Gait training, Patient/Family education, Self Care, Joint mobilization, Stair training, Dry Needling, Electrical stimulation, Spinal mobilization, Cryotherapy, Moist heat, Traction, Ultrasound, Manual therapy, and Re-evaluation.  PLAN FOR NEXT SESSION: 30 day hold   Rennie Natter, PT, DPT  11/15/2022, 9:03 AM  PHYSICAL THERAPY DISCHARGE SUMMARY  Visits from Start of Care: 5  Current functional level related to goals / functional outcomes: Yoceline Bazar reports almost 90% improvement overall in LBP.  Her FOTO has improved to only 39% functional ability.    Her functional strength has improved.  Remaining deficits: Significant irritability in lumbar paraspinals, sharp pain with lumbar AROM, and she reports inability to lift and carry groceries and complete all work activities, and has a lot of difficulty standing at job due to LBP.   Education / Equipment: HEP  Plan: Patient agrees to discharge.  Patient goals were not met. Patient is being discharged due to being placed on 30 day hold on 11/15/2022 and not returning within stated time frame.     Rennie Natter, PT, DPT 8:20 AM 12/16/2022

## 2022-12-30 ENCOUNTER — Encounter: Payer: Medicaid Other | Admitting: Obstetrics and Gynecology

## 2023-01-17 ENCOUNTER — Ambulatory Visit (INDEPENDENT_AMBULATORY_CARE_PROVIDER_SITE_OTHER): Payer: Medicaid Other | Admitting: Obstetrics and Gynecology

## 2023-01-17 ENCOUNTER — Encounter: Payer: Self-pay | Admitting: Obstetrics and Gynecology

## 2023-01-17 VITALS — BP 118/75 | HR 84 | Ht 68.0 in | Wt 203.8 lb

## 2023-01-17 DIAGNOSIS — N920 Excessive and frequent menstruation with regular cycle: Secondary | ICD-10-CM | POA: Insufficient documentation

## 2023-01-17 LAB — CBC
Hematocrit: 27.7 % — ABNORMAL LOW (ref 34.0–46.6)
Hemoglobin: 7.9 g/dL — ABNORMAL LOW (ref 11.1–15.9)
MCH: 18.6 pg — ABNORMAL LOW (ref 26.6–33.0)
MCHC: 28.5 g/dL — ABNORMAL LOW (ref 31.5–35.7)
MCV: 65 fL — ABNORMAL LOW (ref 79–97)
Platelets: 347 10*3/uL (ref 150–450)
RBC: 4.25 x10E6/uL (ref 3.77–5.28)
RDW: 20 % — ABNORMAL HIGH (ref 11.7–15.4)
WBC: 5.1 10*3/uL (ref 3.4–10.8)

## 2023-01-17 MED ORDER — IBUPROFEN 800 MG PO TABS: 800.0000 mg | ORAL_TABLET | Freq: Three times a day (TID) | ORAL | 3 refills | Status: AC | PRN

## 2023-01-17 NOTE — Progress Notes (Signed)
37 yo G5 P5, SVD x 5, 1 year hx Menorrhagia Regular menses last 6-7 days, heavy all six days Clots noted, 5-6 pads during day 4-5 at night Tried progesterone IUD came out 6 months later Pt has not tried any other interventions   CC: menorrhagia Subjective:    Patient ID: Carly Cabrera, female    DOB: 1986-03-02, 37 y.o.   MRN: [id number]  HPI 37 yo G5P5, SVD x 5, seen with 1 year history of regular heavy menses.  Menses last 6-7 days and is heavy all days.  The patient does note clots and moderate cramping.  She can use 5-6 pads on the heaviest day.She has tried progesterone IUDs in the past but it "fell out" after 6 months.The patient has not tried any other interventions.   Review of Systems     Objective:   Physical Exam Vitals reviewed.  Constitutional:      Appearance: Normal appearance. She is normal weight.  HENT:     Head: Normocephalic and atraumatic.  Abdominal:     General: Abdomen is flat.     Palpations: Abdomen is soft. There is no mass.     Tenderness: There is no abdominal tenderness.  Genitourinary:    Comments: SSE:  normal cervix and vagina noted, light bleeding SVE: normal uterine size and mobility, no adnexal masses Neurological:     Mental Status: She is alert.    Vitals:   01/17/23 0844  BP: 118/75  Pulse: 84         Assessment & Plan:   1. Menorrhagia with regular cycle Offered endometrial biopsy today, but pt would like to do it at follow up  visit TVUS ordered to evaluate uterus.  Current treatment options include  Lysteda OCP Progesterone IUD Uterine ablation Hysterectomy  Option may change depending on ultrasound findings  - US PELVIS TRANSVAGINAL NON-OB (TV ONLY); Future - ibuprofen (ADVIL) 800 MG tablet; Take 1 tablet (800 mg total) by mouth 3 (three) times daily with meals as needed for headache, moderate pain or cramping.  Dispense: 30 tablet; Refill: 3 - CBC  F/u in 5 weeks with endometrial biopsy  Griffin Basil,  MD Faculty Attending, [facility]

## 2023-01-22 ENCOUNTER — Telehealth: Payer: Self-pay | Admitting: *Deleted

## 2023-01-22 NOTE — Telephone Encounter (Addendum)
-----   Message from Griffin Basil, MD sent at 01/20/2023 11:05 AM EST ----- Anemia noted, offer iron infusion, if patient agrees alert MD and MD will place orders  2/14 1305  Called pt and she did not answer. Message left on her personal VM stating that her recent lab test shows her iron level is lower than last September - meaning worsening anemia. Dr. Elgie Congo is offering iron infusion if she would like. Please contact our office and let us know if she wants the infusion so that we can schedule the appointment.

## 2023-01-24 ENCOUNTER — Ambulatory Visit (HOSPITAL_BASED_OUTPATIENT_CLINIC_OR_DEPARTMENT_OTHER)
Admission: RE | Admit: 2023-01-24 | Discharge: 2023-01-24 | Disposition: A | Discharge status: Home / Self Care | Payer: Medicaid Other | Source: Ambulatory Visit | Attending: Obstetrics and Gynecology | Admitting: Obstetrics and Gynecology

## 2023-01-24 DIAGNOSIS — N920 Excessive and frequent menstruation with regular cycle: Secondary | ICD-10-CM | POA: Insufficient documentation

## 2023-01-27 ENCOUNTER — Other Ambulatory Visit: Payer: Self-pay | Admitting: Obstetrics and Gynecology

## 2023-01-27 NOTE — Telephone Encounter (Signed)
Called patient and discussed iron infusion- she is agreeable to that. Scheduled infusion for 2/23 @ 10am. Patient verbalized understanding.

## 2023-01-27 NOTE — Progress Notes (Signed)
Orders for iron infusion placed.  Pt can take oral iron, but due to hemoglobin of 7.9 would likely benefit from additional therapy.  Derinda Late RN will schedule appt

## 2023-01-29 ENCOUNTER — Telehealth: Payer: Self-pay | Admitting: Pharmacy Technician

## 2023-01-29 ENCOUNTER — Other Ambulatory Visit: Payer: Self-pay | Admitting: Pharmacy Technician

## 2023-01-29 NOTE — Telephone Encounter (Signed)
Dr. Elgie Congo,  Juluis Rainier note:  Auth Submission: NO AUTH NEEDED Payer: [employer] [place] [facility] CPT/J Code(s) submitted: Venofer (Iron Sucrose) J1756 Route of submission (phone, fax, portal):  Phone # Fax # Auth type: Buy/Bill Units/visits requested: 2 Reference number:  Approval from: 01/29/23 to 06/29/23   Patient will be scheduled as soon as possible

## 2023-01-31 ENCOUNTER — Encounter (HOSPITAL_COMMUNITY): Payer: Medicaid Other

## 2023-02-03 ENCOUNTER — Non-Acute Institutional Stay (HOSPITAL_COMMUNITY)
Admission: RE | Admit: 2023-02-03 | Discharge: 2023-02-03 | Disposition: A | Discharge status: Home / Self Care | Payer: Medicaid Other | Source: Ambulatory Visit | Attending: Internal Medicine | Admitting: Internal Medicine

## 2023-02-03 DIAGNOSIS — N920 Excessive and frequent menstruation with regular cycle: Secondary | ICD-10-CM | POA: Diagnosis present

## 2023-02-03 MED ORDER — DIPHENHYDRAMINE HCL 25 MG PO CAPS
25.0000 mg | ORAL_CAPSULE | Freq: Once | ORAL | Status: AC
  Administered 2023-02-03: 25 mg via ORAL
  Filled 2023-02-03: qty 1

## 2023-02-03 MED ORDER — SODIUM CHLORIDE 0.9 % IV SOLN: INTRAVENOUS | Status: DC | PRN

## 2023-02-03 MED ORDER — ACETAMINOPHEN 325 MG PO TABS
650.0000 mg | ORAL_TABLET | Freq: Once | ORAL | Status: AC
  Administered 2023-02-03: 650 mg via ORAL
  Filled 2023-02-03: qty 2

## 2023-02-03 MED ORDER — IRON SUCROSE 500 MG IVPB - SIMPLE MED
500.0000 mg | Freq: Once | INTRAVENOUS | Status: AC
Start: 2023-02-03 — End: 2023-02-03
  Administered 2023-02-03: 500 mg via INTRAVENOUS
  Filled 2023-02-03: qty 500

## 2023-02-03 NOTE — Progress Notes (Signed)
PATIENT CARE CENTER NOTE   Diagnosis: Menorrhagia with regular cycle [N92.0]    Provider: Lynnda Shields, MD   Procedure: Venofer 500 mg   Note: Patient received Venofer 500 mg infusion (dose 1 of 2) via PIV. Patient pre-medicated with Tylenol and Benadryl per order. Patient reported some soreness in right hand around IV site but otherwise tolerated infusion well. Patient had to leave 10 minutes prior to completion of infusion to pick up her child. Vital signs stable. Discharge instructions given. Patient to come back in 2 weeks for second infusion. Patient will call back to make next appointment. Patient alert, oriented and ambulatory at discharge.

## 2023-02-13 ENCOUNTER — Emergency Department (HOSPITAL_BASED_OUTPATIENT_CLINIC_OR_DEPARTMENT_OTHER): Payer: Medicaid Other

## 2023-02-13 ENCOUNTER — Encounter (HOSPITAL_BASED_OUTPATIENT_CLINIC_OR_DEPARTMENT_OTHER): Payer: Self-pay

## 2023-02-13 ENCOUNTER — Other Ambulatory Visit: Payer: Self-pay

## 2023-02-13 ENCOUNTER — Emergency Department (HOSPITAL_BASED_OUTPATIENT_CLINIC_OR_DEPARTMENT_OTHER)
Admission: EM | Admit: 2023-02-13 | Discharge: 2023-02-14 | Disposition: A | Discharge status: Home / Self Care | Payer: Medicaid Other | Attending: Emergency Medicine | Admitting: Emergency Medicine

## 2023-02-13 ENCOUNTER — Encounter: Payer: Self-pay | Admitting: Obstetrics and Gynecology

## 2023-02-13 DIAGNOSIS — R109 Unspecified abdominal pain: Secondary | ICD-10-CM | POA: Diagnosis present

## 2023-02-13 DIAGNOSIS — R1084 Generalized abdominal pain: Secondary | ICD-10-CM | POA: Insufficient documentation

## 2023-02-13 DIAGNOSIS — R110 Nausea: Secondary | ICD-10-CM | POA: Diagnosis not present

## 2023-02-13 LAB — CBC
HCT: 28.7 % — ABNORMAL LOW (ref 36.0–46.0)
Hemoglobin: 8.6 g/dL — ABNORMAL LOW (ref 12.0–15.0)
MCH: 20.1 pg — ABNORMAL LOW (ref 26.0–34.0)
MCHC: 30 g/dL (ref 30.0–36.0)
MCV: 67.2 fL — ABNORMAL LOW (ref 80.0–100.0)
Platelets: 330 10*3/uL (ref 150–400)
RBC: 4.27 MIL/uL (ref 3.87–5.11)
RDW: 25.2 % — ABNORMAL HIGH (ref 11.5–15.5)
WBC: 5.8 10*3/uL (ref 4.0–10.5)
nRBC: 0 % (ref 0.0–0.2)

## 2023-02-13 LAB — URINALYSIS, ROUTINE W REFLEX MICROSCOPIC
Bilirubin Urine: NEGATIVE
Glucose, UA: NEGATIVE mg/dL
Ketones, ur: NEGATIVE mg/dL
Nitrite: NEGATIVE
Protein, ur: 30 mg/dL — AB
Specific Gravity, Urine: 1.02 (ref 1.005–1.030)
pH: 5.5 (ref 5.0–8.0)

## 2023-02-13 LAB — URINALYSIS, MICROSCOPIC (REFLEX): RBC / HPF: >50 RBC/hpf (ref 0–5)

## 2023-02-13 LAB — COMPREHENSIVE METABOLIC PANEL
ALT: 14 U/L (ref 0–44)
AST: 19 U/L (ref 15–41)
Albumin: 3.6 g/dL (ref 3.5–5.0)
Alkaline Phosphatase: 59 U/L (ref 38–126)
Anion gap: 6 (ref 5–15)
BUN: 15 mg/dL (ref 6–20)
CO2: 22 mmol/L (ref 22–32)
Calcium: 8.6 mg/dL — ABNORMAL LOW (ref 8.9–10.3)
Chloride: 106 mmol/L (ref 98–111)
Creatinine, Ser: 0.79 mg/dL (ref 0.44–1.00)
GFR, Estimated: >60 mL/min (ref >60)
Glucose, Bld: 100 mg/dL — ABNORMAL HIGH (ref 70–99)
Potassium: 3.8 mmol/L (ref 3.5–5.1)
Sodium: 134 mmol/L — ABNORMAL LOW (ref 135–145)
Total Bilirubin: 0.3 mg/dL (ref 0.3–1.2)
Total Protein: 7.5 g/dL (ref 6.5–8.1)

## 2023-02-13 LAB — PREGNANCY, URINE: Preg Test, Ur: NEGATIVE

## 2023-02-13 LAB — LIPASE, BLOOD: Lipase: 44 U/L (ref 11–51)

## 2023-02-13 MED ORDER — SODIUM CHLORIDE 0.9 % IV BOLUS
1000.0000 mL | Freq: Once | INTRAVENOUS | Status: AC
  Administered 2023-02-13: 1000 mL via INTRAVENOUS

## 2023-02-13 MED ORDER — IOHEXOL 300 MG/ML  SOLN
100.0000 mL | Freq: Once | INTRAMUSCULAR | Status: AC | PRN
  Administered 2023-02-13: 100 mL via INTRAVENOUS

## 2023-02-13 MED ORDER — ONDANSETRON 4 MG PO TBDP: 4.0000 mg | ORAL_TABLET | Freq: Three times a day (TID) | ORAL | 0 refills | Status: DC | PRN

## 2023-02-13 MED ORDER — ONDANSETRON HCL 4 MG/2ML IJ SOLN
4.0000 mg | Freq: Once | INTRAMUSCULAR | Status: AC
  Administered 2023-02-13: 4 mg via INTRAVENOUS
  Filled 2023-02-13: qty 2

## 2023-02-13 NOTE — Discharge Instructions (Signed)
As we discussed, your workup in the ER today was reassuring for acute findings.  Laboratory evaluation and CT imaging did not reveal any emergent concerns.  I have given you a prescription for Zofran for you to take as prescribed as needed for management of your symptoms.  Follow-up with your primary care doctor for continued evaluation and management.  Return if development of any new or worsening symptoms.

## 2023-02-13 NOTE — ED Notes (Signed)
Discussed need for urinae sample, pt states she will notify RN when she needs to go

## 2023-02-13 NOTE — ED Triage Notes (Signed)
Pt arrives with c/o ABD pain that started this morning. Pt endorses nausea. Pt denies fevers.

## 2023-02-13 NOTE — ED Provider Notes (Signed)
[place] [facility] Provider Note   CSN: [id number] Arrival date & time: 02/13/23  1923     History  Chief Complaint  Patient presents with   Abdominal Pain    Carly Cabrera is a 37 y.o. female.  Patient with no pertinent past medical history presents today with complaints of abdominal pain and nausea. She states that same has been ongoing since she woke up this morning. She denies any vomiting or diarrhea.  She is having regular bowel movements.  She started her menstrual cycle this morning as well which is on time for her.  Pain is located throughout her abdomen and is not localized. Denies any history of similar symptoms previously. No fevers or chills. Denies hematuria or dysuria.  Denies any history of abdominal surgeries.  The history is provided by the patient. No language interpreter was used.  Abdominal Pain Associated symptoms: nausea        Home Medications Prior to Admission medications   Medication Sig Start Date End Date Taking? Authorizing Provider  amLODipine (NORVASC) 10 MG tablet Take 1 tablet (10 mg total) by mouth daily. Patient not taking: Reported on 01/17/2023 06/24/17   Truett Mainland, DO  gabapentin (NEURONTIN) 300 MG capsule Take 1 capsule (300 mg total) by mouth 3 (three) times daily. Patient not taking: Reported on 01/17/2023 08/07/22   Rosemarie Ax, MD  ibuprofen (ADVIL) 800 MG tablet Take 1 tablet (800 mg total) by mouth 3 (three) times daily with meals as needed for headache, moderate pain or cramping. 01/17/23   Griffin Basil, MD  metFORMIN (GLUCOPHAGE) 500 MG tablet Take by mouth 2 (two) times daily with a meal. Patient not taking: Reported on 01/17/2023    [provider]  methocarbamol (ROBAXIN) 500 MG tablet Take 1 tablet (500 mg total) by mouth every 8 (eight) hours as needed for muscle spasms. Patient not taking: Reported on 01/17/2023 10/08/19   Ward, Delice Bison, DO  naproxen (NAPROSYN) 375 MG  tablet Take 1 tablet (375 mg total) by mouth 2 (two) times daily. Patient not taking: Reported on 01/17/2023 06/17/22   Dorie Rank, MD  omeprazole (PRILOSEC) 40 MG capsule Take 1 capsule (40 mg total) by mouth daily for 14 days. First thing in the morning, 20-30 minutes before eating 01/01/18 01/15/18  Duffy Bruce, MD  predniSONE (DELTASONE) 5 MG tablet Take 6 pills for first day, 5 pills second day, 4 pills third day, 3 pills fourth day, 2 pills the fifth day, and 1 pill sixth day. Patient not taking: Reported on 01/17/2023 07/16/22   Rosemarie Ax, MD  Prenatal Vit-Fe Fumarate-FA (MULTIVITAMIN-PRENATAL) 27-0.8 MG TABS tablet Take 1 tablet by mouth daily at 12 noon. Patient not taking: Reported on 01/17/2023    [provider]  sucralfate (CARAFATE) 1 g tablet Take 1 tablet (1 g total) by mouth 4 (four) times daily -  with meals and at bedtime for 14 days. 01/01/18 01/15/18  Duffy Bruce, MD      Allergies    Patient has no known allergies.    Review of Systems   Review of Systems  Gastrointestinal:  Positive for abdominal pain and nausea.  All other systems reviewed and are negative.   Physical Exam Updated Vital Signs BP 105/73   Pulse (!) 59   Temp 98.2 F (36.8 C)   Resp 15   Wt 93 kg   LMP 01/13/2023 (Exact Date) Comment: lasting 5 days; very heavy bleeding  SpO2 100%   BMI 31.17 kg/m  Physical Exam Vitals and nursing note reviewed.  Constitutional:      General: She is not in acute distress.    Appearance: Normal appearance. She is normal weight. She is not ill-appearing, toxic-appearing or diaphoretic.  HENT:     Head: Normocephalic and atraumatic.  Cardiovascular:     Rate and Rhythm: Normal rate.  Pulmonary:     Effort: Pulmonary effort is normal. No respiratory distress.  Abdominal:     General: Abdomen is flat.     Palpations: Abdomen is soft.     Tenderness: There is generalized abdominal tenderness.  Musculoskeletal:        General: Normal range of  motion.     Cervical back: Normal range of motion.  Skin:    General: Skin is warm and dry.  Neurological:     General: No focal deficit present.     Mental Status: She is alert.  Psychiatric:        Mood and Affect: Mood normal.        Behavior: Behavior normal.     ED Results / Procedures / Treatments   Labs (all labs ordered are listed, but only abnormal results are displayed) Labs Reviewed  COMPREHENSIVE METABOLIC PANEL - Abnormal; Notable for the following components:      Result Value   Sodium 134 (*)    Glucose, Bld 100 (*)    Calcium 8.6 (*)    All other components within normal limits  CBC - Abnormal; Notable for the following components:   Hemoglobin 8.6 (*)    HCT 28.7 (*)    MCV 67.2 (*)    MCH 20.1 (*)    RDW 25.2 (*)    All other components within normal limits  URINALYSIS, ROUTINE W REFLEX MICROSCOPIC - Abnormal; Notable for the following components:   Color, Urine BROWN (*)    APPearance TURBID (*)    Hgb urine dipstick LARGE (*)    Protein, ur 30 (*)    Leukocytes,Ua TRACE (*)    All other components within normal limits  URINALYSIS, MICROSCOPIC (REFLEX) - Abnormal; Notable for the following components:   Bacteria, UA RARE (*)    All other components within normal limits  LIPASE, BLOOD  PREGNANCY, URINE    EKG None  Radiology CT ABDOMEN PELVIS W CONTRAST  Result Date: 02/13/2023 CLINICAL DATA:  Abdominal pain EXAM: CT ABDOMEN AND PELVIS WITH CONTRAST TECHNIQUE: Multidetector CT imaging of the abdomen and pelvis was performed using the standard protocol following bolus administration of intravenous contrast. RADIATION DOSE REDUCTION: This exam was performed according to the departmental dose-optimization program which includes automated exposure control, adjustment of the mA and/or kV according to patient size and/or use of iterative reconstruction technique. CONTRAST:  158m OMNIPAQUE IOHEXOL 300 MG/ML  SOLN COMPARISON:  02/10/2020 FINDINGS: Lower  chest: Lung bases are clear. Hepatobiliary: Liver is within normal limits. Gallbladder is unremarkable. No intrahepatic or extrahepatic duct dilatation. Pancreas: Within normal limits. Spleen: Within normal limits. Adrenals/Urinary Tract: Adrenal glands are within normal limits. Kidneys are within normal limits.  No hydronephrosis. Thick-walled bladder, unchanged. Stomach/Bowel: Stomach is within normal limits. No evidence of bowel obstruction. Normal appendix (series 2/image 81). No colonic wall thickening or inflammatory changes. Vascular/Lymphatic: No evidence of abdominal aortic aneurysm. No suspicious abdominopelvic lymphadenopathy. Reproductive: Uterus is within normal limits. Bilateral ovaries are within normal limits. Other: No abdominopelvic ascites. Musculoskeletal: Visualized osseous structures are within normal limits. IMPRESSION: Thick-walled bladder,  unchanged. Correlate for cystitis. Otherwise negative CT abdomen/pelvis. Electronically Signed   By: Julian Hy M.D.   On: 02/13/2023 22:35    Procedures Procedures    Medications Ordered in ED Medications  sodium chloride 0.9 % bolus 1,000 mL (0 mLs Intravenous Stopped 02/13/23 2320)  ondansetron (ZOFRAN) injection 4 mg (4 mg Intravenous Given 02/13/23 2054)  iohexol (OMNIPAQUE) 300 MG/ML solution 100 mL (100 mLs Intravenous Contrast Given 02/13/23 2219)    ED Course/ Medical Decision Making/ A&P                             Medical Decision Making Amount and/or Complexity of Data Reviewed Labs: ordered. Radiology: ordered.  Risk Prescription drug management.   This patient is a 37 y.o. female who presents to the ED for concern of abdominal pain, nausea, this involves an extensive number of treatment options, and is a complaint that carries with it a high risk of complications and morbidity. The emergent differential diagnosis prior to evaluation includes, but is not limited to, gastroenteritis, appendicitis, Bowel obstruction,  Bowel perforation. Gastroparesis, DKA, Hernia, Inflammatory bowel disease, mesenteric ischemia, pancreatitis, peritonitis SBP, volvulus.   This is not an exhaustive differential.   Physical Exam: Physical exam performed. The pertinent findings include: generalized abdominal tenderness to palpation.  No rebound tenderness or guarding.  Lab Tests: I ordered, and personally interpreted labs.  The pertinent results include:  hgb 8.6 improved from previous. UA noninfectious, bleeding consistent with patient on her menstrual cycle   Imaging Studies: I ordered imaging studies including CT abdomen pelvis. I independently visualized and interpreted imaging which showed   Thick-walled bladder, unchanged. Correlate for cystitis.   Otherwise negative CT abdomen/pelvis.  I agree with the radiologist interpretation.    Medications: I ordered medication including fluids, zofran  for nausea, dehydration. Reevaluation of the patient after these medicines showed that the patient improved. I have reviewed the patients home medicines and have made adjustments as needed.    Disposition:  Patient presents today with generalized abdominal pain since this morning.  She is afebrile, nontoxic-appearing, and in no acute distress with reassuring vital signs. Patient is nontoxic, nonseptic appearing, in no apparent distress.  Patient's pain and other symptoms adequately managed in emergency department.  Fluid bolus given.  Labs, imaging and vitals reviewed.  Patient does not meet the SIRS or Sepsis criteria.  On repeat exam patient does not have a surgical abdomin and there are no peritoneal signs.  No indication of appendicitis, bowel obstruction, bowel perforation, cholecystitis, diverticulitis, PID or ectopic pregnancy.  CT does show concerns for cystitis, however patient denies any urinary symptoms.  After fluids and Zofran, patient states that she is feeling significantly improved and is ready to go home.   Patient discharged home with Zofran for symptomatic treatment and given strict instructions for follow-up with their primary care physician.  I have also discussed reasons to return immediately to the ER.  Patient expresses understanding and agrees with plan.  Patient discharged in stable condition.   Final Clinical Impression(s) / ED Diagnoses Final diagnoses:  Generalized abdominal pain  Nausea    Rx / DC Orders ED Discharge Orders          Ordered    ondansetron (ZOFRAN-ODT) 4 MG disintegrating tablet  Every 8 hours PRN        02/13/23 2342          An After Visit Summary was printed  and given to the patient.     Nestor Lewandowsky 02/13/23 2343    Drenda Freeze, MD 02/17/23 1455

## 2023-02-19 ENCOUNTER — Encounter: Payer: Self-pay | Admitting: Obstetrics and Gynecology

## 2023-02-24 ENCOUNTER — Ambulatory Visit: Payer: Medicaid Other | Admitting: Obstetrics and Gynecology

## 2023-02-28 ENCOUNTER — Encounter: Payer: Self-pay | Admitting: Obstetrics and Gynecology

## 2023-03-07 ENCOUNTER — Non-Acute Institutional Stay (HOSPITAL_COMMUNITY)
Admission: RE | Admit: 2023-03-07 | Discharge: 2023-03-07 | Disposition: A | Discharge status: Home / Self Care | Payer: Medicaid Other | Source: Ambulatory Visit | Attending: Internal Medicine | Admitting: Internal Medicine

## 2023-03-07 DIAGNOSIS — N920 Excessive and frequent menstruation with regular cycle: Secondary | ICD-10-CM | POA: Diagnosis present

## 2023-03-07 MED ORDER — IRON SUCROSE 500 MG IVPB - SIMPLE MED
500.0000 mg | Freq: Once | INTRAVENOUS | Status: AC
  Administered 2023-03-07: 500 mg via INTRAVENOUS
  Filled 2023-03-07: qty 275

## 2023-03-07 MED ORDER — ACETAMINOPHEN 325 MG PO TABS
650.0000 mg | ORAL_TABLET | Freq: Once | ORAL | Status: AC
  Administered 2023-03-07: 650 mg via ORAL
  Filled 2023-03-07: qty 2

## 2023-03-07 MED ORDER — DIPHENHYDRAMINE HCL 25 MG PO CAPS
25.0000 mg | ORAL_CAPSULE | Freq: Once | ORAL | Status: AC
  Administered 2023-03-07: 25 mg via ORAL
  Filled 2023-03-07: qty 1

## 2023-03-07 MED ORDER — SODIUM CHLORIDE 0.9 % IV SOLN: INTRAVENOUS | Status: DC | PRN

## 2023-03-07 NOTE — Progress Notes (Signed)
PATIENT CARE CENTER NOTE     Diagnosis: Menorrhagia with regular cycle [N92.0]      Provider: Lynnda Shields, MD     Procedure: Venofer 500 mg     Note: Patient received Venofer 500 mg infusion (dose # 2 of 2) via PIV. Patient pre-medicated with Tylenol and Benadryl per order. Patient tolerated infusion well. Vital signs stable. Printed AVS offered but patient refused.  Patient alert, oriented and ambulatory at discharge.

## 2023-03-24 ENCOUNTER — Encounter: Payer: Self-pay | Admitting: *Deleted

## 2023-04-07 ENCOUNTER — Ambulatory Visit (INDEPENDENT_AMBULATORY_CARE_PROVIDER_SITE_OTHER): Payer: Medicaid Other | Admitting: Obstetrics and Gynecology

## 2023-04-07 ENCOUNTER — Other Ambulatory Visit (HOSPITAL_COMMUNITY)
Admission: RE | Admit: 2023-04-07 | Discharge: 2023-04-07 | Disposition: A | Discharge status: Home / Self Care | Payer: Medicaid Other | Source: Ambulatory Visit | Attending: Obstetrics and Gynecology | Admitting: Obstetrics and Gynecology

## 2023-04-07 ENCOUNTER — Other Ambulatory Visit: Payer: Self-pay

## 2023-04-07 ENCOUNTER — Encounter: Payer: Self-pay | Admitting: Obstetrics and Gynecology

## 2023-04-07 VITALS — BP 118/83 | HR 67 | Wt 203.4 lb

## 2023-04-07 DIAGNOSIS — N920 Excessive and frequent menstruation with regular cycle: Secondary | ICD-10-CM | POA: Insufficient documentation

## 2023-04-07 LAB — POCT PREGNANCY, URINE: Preg Test, Ur: NEGATIVE

## 2023-04-07 MED ORDER — TRANEXAMIC ACID 650 MG PO TABS: 1300.0000 mg | ORAL_TABLET | Freq: Three times a day (TID) | ORAL | 7 refills | Status: DC

## 2023-04-07 MED ORDER — IBUPROFEN 800 MG PO TABS
800.0000 mg | ORAL_TABLET | Freq: Once | ORAL | Status: AC
  Administered 2023-04-07: 800 mg via ORAL

## 2023-04-07 NOTE — Progress Notes (Signed)
CC: menorrhagia Subjective:    Patient ID: Carly Cabrera, female    DOB: Jun 07, 1986, 37 y.o.   MRN: [id number]  HPI Pt seen for follow up and endometrial biopsy.  Reviewed ultrasound findings and treatment options again with patient.  Reviewed care everywhere for recent pap.  Provider is not in the system so release of information signed to try and obtain the pap smear results.  ENDOMETRIAL BIOPSY      Carly Cabrera is a 37 y.o. [id number] here for endometrial biopsy.  The indications for endometrial biopsy were reviewed.  Risks of the biopsy including cramping, bleeding, infection, uterine perforation, inadequate specimen and need for additional procedures were discussed. The patient states she understands and agrees to undergo procedure today. Consent was signed. Time out was performed.   Indications: menorrhagia Urine HCG: neg  A bivalve speculum was placed into the vagina and the cervix was easily visualized and was prepped with Betadine x2. A single-toothed tenaculum was placed on the anterior lip of the cervix to stabilize it. The 3 mm pipelle was introduced into the endometrial cavity without difficulty to a depth of 10 cm, and a moderate amount of tissue was obtained and sent to pathology. This was repeated for a total of 2 passes. The instruments were removed from the patient's vagina. Minimal bleeding from the cervix at the tenaculum was noted.   The patient tolerated the procedure well. Routine post-procedure instructions were given to the patient.    Will base further management on results of biopsy.   Review of Systems     Objective:   Physical Exam Vitals:   04/07/23 0847  BP: 118/83  Pulse: 67   CLINICAL DATA:  Initial evaluation for menorrhagia.   EXAM: TRANSABDOMINAL AND TRANSVAGINAL ULTRASOUND OF PELVIS   TECHNIQUE: Both transabdominal and transvaginal ultrasound examinations of the pelvis were performed. Transabdominal technique was performed for global  imaging of the pelvis including uterus, ovaries, adnexal regions, and pelvic cul-de-sac. It was necessary to proceed with endovaginal exam following the transabdominal exam to visualize the endometrium.   COMPARISON:  Prior ultrasound from 02/10/2020.   FINDINGS: Uterus   Measurements: 13.1 x 5.3 x 6.2 cm = volume: 227.0 mL. Uterus is anteverted. No discrete fibroid or other myometrial abnormality.   Endometrium   Thickness: 16.3 mm.  No focal abnormality visualized.   Right ovary   Measurements: 3.9 x 2.0 x 3.1 cm = volume: 11.7 mL. Normal appearance/no adnexal mass. Small volume of follicle noted   Left ovary   Measurements: 3.1 x 1.2 x 2.7 cm = volume: 5.4 mL. Normal appearance/no adnexal mass.   Other findings   No abnormal free fluid.   IMPRESSION: 1. Endometrial stripe measures up to 16 mm in thickness. If bleeding remains unresponsive to hormonal or medical therapy, focal lesion work-up with sonohysterogram should be considered. Endometrial biopsy should also be considered in pre-menopausal patients at high risk for endometrial carcinoma. (Ref: Radiological Reasoning: Algorithmic Workup of Abnormal Vaginal Bleeding with Endovaginal Sonography and Sonohysterography. AJR 2008; 147:W29-56). 2. Otherwise unremarkable and normal pelvic ultrasound.      Assessment & Plan:   1. Menorrhagia with regular cycle Endometrial biopsy results are pending. Again discussed treatment options.  See previous note. Pt desires trial of lysteda.  Follow up in 4 months to discuss treatment  - tranexamic acid (LYSTEDA) 650 MG TABS tablet; Take 2 tablets (1,300 mg total) by mouth 3 (three) times daily. Take during menses for a maximum of five days  Dispense: 30 tablet; Refill: 7    Warden Fillers, MD Faculty Attending, Center for [facility]

## 2023-04-07 NOTE — Addendum Note (Signed)
Addended by: Brien Mates T on: 04/07/2023 10:12 AM   Modules accepted: Orders

## 2023-04-07 NOTE — Addendum Note (Signed)
Addended by: Warden Fillers on: 04/07/2023 10:22 AM   Modules accepted: Orders

## 2023-04-09 LAB — SURGICAL PATHOLOGY

## 2023-07-02 ENCOUNTER — Other Ambulatory Visit: Payer: Self-pay

## 2023-07-02 ENCOUNTER — Emergency Department (HOSPITAL_BASED_OUTPATIENT_CLINIC_OR_DEPARTMENT_OTHER)
Admission: EM | Admit: 2023-07-02 | Discharge: 2023-07-02 | Disposition: A | Discharge status: Home / Self Care | Payer: Medicaid Other | Attending: Emergency Medicine | Admitting: Emergency Medicine

## 2023-07-02 ENCOUNTER — Encounter (HOSPITAL_BASED_OUTPATIENT_CLINIC_OR_DEPARTMENT_OTHER): Payer: Self-pay

## 2023-07-02 DIAGNOSIS — U071 COVID-19: Secondary | ICD-10-CM | POA: Diagnosis not present

## 2023-07-02 DIAGNOSIS — R059 Cough, unspecified: Secondary | ICD-10-CM | POA: Diagnosis present

## 2023-07-02 LAB — URINALYSIS, ROUTINE W REFLEX MICROSCOPIC
Bilirubin Urine: NEGATIVE
Glucose, UA: NEGATIVE mg/dL
Hgb urine dipstick: NEGATIVE
Ketones, ur: NEGATIVE mg/dL
Leukocytes,Ua: NEGATIVE
Nitrite: NEGATIVE
Protein, ur: NEGATIVE mg/dL
Specific Gravity, Urine: 1.015 (ref 1.005–1.030)
pH: 7.5 (ref 5.0–8.0)

## 2023-07-02 LAB — SARS CORONAVIRUS 2 BY RT PCR: SARS Coronavirus 2 by RT PCR: POSITIVE — AB

## 2023-07-02 MED ORDER — ACETAMINOPHEN 325 MG PO TABS
650.0000 mg | ORAL_TABLET | Freq: Once | ORAL | Status: AC
  Administered 2023-07-02: 650 mg via ORAL
  Filled 2023-07-02: qty 2

## 2023-07-02 MED ORDER — BENZONATATE 100 MG PO CAPS: 100.0000 mg | ORAL_CAPSULE | Freq: Three times a day (TID) | ORAL | 0 refills | Status: AC

## 2023-07-02 NOTE — Discharge Instructions (Addendum)
Please follow-up with your primary care doctor, as needed.  You have COVID-19, please quarantine and isolate for least 5 days, after your symptoms start.  Take Tylenol and ibuprofen for your pain, and symptoms.  I have also prescribed you some medicine to help with your coughing, you can take it to help with your cough.  Return to the ER if your oxygen saturation is less than 90%, you have you become very short of breath, or chest pain.

## 2023-07-02 NOTE — ED Provider Notes (Signed)
[place] [facility] Provider Note   CSN: [id number] Arrival date & time: 07/02/23  2133     History  Chief Complaint  Patient presents with   Cough   Back Pain    Carly Cabrera is a 37 y.o. female, no pertinent past medical history, who presents to the ED secondary to a developing low back pain, cough, sore throat, muscle aches, in the past day.  She states that she has a dry cough, has not had any fevers, no nausea, vomiting, or urinary symptoms.  States that she does have a bad back and it flares up from time to time.  Home Medications Prior to Admission medications   Medication Sig Start Date End Date Taking? Authorizing Provider  benzonatate (TESSALON) 100 MG capsule Take 1 capsule (100 mg total) by mouth every 8 (eight) hours. 07/02/23  Yes Timtohy Broski L, PA  clarithromycin (BIAXIN) 500 MG tablet Take 500 mg by mouth 2 (two) times daily. 03/20/23   [provider]  doxycycline (VIBRAMYCIN) 100 MG capsule Take 100 mg by mouth 2 (two) times daily. 02/20/23   [provider]  ibuprofen (ADVIL) 800 MG tablet Take 1 tablet (800 mg total) by mouth 3 (three) times daily with meals as needed for headache, moderate pain or cramping. 01/17/23   Warden Fillers, MD  metroNIDAZOLE (FLAGYL) 500 MG tablet Take 500 mg by mouth 2 (two) times daily. 03/20/23   [provider]  omeprazole (PRILOSEC) 40 MG capsule Take 1 capsule (40 mg total) by mouth daily for 14 days. First thing in the morning, 20-30 minutes before eating 01/01/18 01/15/18  Shaune Pollack, MD  sucralfate (CARAFATE) 1 g tablet Take 1 tablet (1 g total) by mouth 4 (four) times daily -  with meals and at bedtime for 14 days. 01/01/18 01/15/18  Shaune Pollack, MD  tranexamic acid (LYSTEDA) 650 MG TABS tablet Take 2 tablets (1,300 mg total) by mouth 3 (three) times daily. Take during menses for a maximum of five days 04/07/23   Warden Fillers, MD      Allergies    Patient  has no known allergies.    Review of Systems   Review of Systems  Constitutional:  Negative for fever.  Respiratory:  Positive for cough. Negative for shortness of breath.   Musculoskeletal:  Positive for back pain.    Physical Exam Updated Vital Signs BP 111/81 (BP Location: Left Arm)   Pulse (!) 117   Temp 99.2 F (37.3 C)   Resp 18   Ht 5\' 8"  (1.727 m)   Wt 96.3 kg   LMP 06/09/2023 (Exact Date)   SpO2 100%   BMI 32.28 kg/m  Physical Exam Vitals and nursing note reviewed.  Constitutional:      General: She is not in acute distress.    Appearance: She is well-developed.  HENT:     Head: Normocephalic and atraumatic.     Right Ear: Tympanic membrane normal.     Left Ear: Tympanic membrane normal.     Nose: Congestion present.     Mouth/Throat:     Mouth: Mucous membranes are moist.     Pharynx: No oropharyngeal exudate or posterior oropharyngeal erythema.  Eyes:     Conjunctiva/sclera: Conjunctivae normal.  Cardiovascular:     Rate and Rhythm: Normal rate and regular rhythm.     Heart sounds: No murmur heard. Pulmonary:     Effort: Pulmonary effort is normal. No respiratory distress.  Breath sounds: Normal breath sounds.  Abdominal:     Palpations: Abdomen is soft.     Tenderness: There is no abdominal tenderness.  Musculoskeletal:        General: No swelling.     Cervical back: Neck supple.     Comments: Minimal tenderness to palpation of lumbar paraspinal muscles.  Skin:    General: Skin is warm and dry.     Capillary Refill: Capillary refill takes less than 2 seconds.  Neurological:     Mental Status: She is alert.  Psychiatric:        Mood and Affect: Mood normal.     ED Results / Procedures / Treatments   Labs (all labs ordered are listed, but only abnormal results are displayed) Labs Reviewed  SARS CORONAVIRUS 2 BY RT PCR - Abnormal; Notable for the following components:      Result Value   SARS Coronavirus 2 by RT PCR POSITIVE (*)    All  other components within normal limits  URINALYSIS, ROUTINE W REFLEX MICROSCOPIC    EKG None  Radiology No results found.  Procedures Procedures    Medications Ordered in ED Medications  acetaminophen (TYLENOL) tablet 650 mg (has no administration in time range)    ED Course/ Medical Decision Making/ A&P                             Medical Decision Making Patient is a 37 year old female, here for sore throat, cough, body aches, this been going on for about a day.  She denies any sick contacts, has not traveled outside the country.  We will obtain a COVID test, as well as urinalysis, to check and see if she has a urinary tract infection given she has low back pain.  Is appears to be more perispinal with no CVA tenderness.  Amount and/or Complexity of Data Reviewed Labs: ordered.    Details: COVID-positive, urinalysis negative Discussion of management or test interpretation with external provider(s): Discussed with patient she is COVID-positive overall well-appearing, her oxygen saturations is at 100%, and she is speaking without any shortness of breath.  We will have her follow-up with her primary care doctor as needed.  Quarantine for 5 days, and take Tylenol and ibuprofen for pain control and fever control.  Return precautions emphasized  Risk OTC drugs. Prescription drug management.   Final Clinical Impression(s) / ED Diagnoses Final diagnoses:  COVID-19    Rx / DC Orders ED Discharge Orders          Ordered    benzonatate (TESSALON) 100 MG capsule  Every 8 hours        07/02/23 2218              Sohil Timko, [place]ley [place], [place] 07/02/23 2220    Glyn Ade, MD 07/02/23 2310

## 2023-07-02 NOTE — ED Notes (Signed)
Pt presents with a cough, body aches and fever x 1 day Covid swab sent No respiratory distress noted

## 2023-07-02 NOTE — ED Triage Notes (Addendum)
Pt here with URI like symptoms (runny nose, dry cough, subjective fever) x1 day. Pt also reports mid to lower back hurts and LT flank. Denies urinary sx.

## 2023-09-14 ENCOUNTER — Encounter (HOSPITAL_BASED_OUTPATIENT_CLINIC_OR_DEPARTMENT_OTHER): Payer: Self-pay

## 2023-09-14 ENCOUNTER — Emergency Department (HOSPITAL_BASED_OUTPATIENT_CLINIC_OR_DEPARTMENT_OTHER)
Admission: EM | Admit: 2023-09-14 | Discharge: 2023-09-14 | Disposition: A | Discharge status: Home / Self Care | Payer: Medicaid Other | Attending: Emergency Medicine | Admitting: Emergency Medicine

## 2023-09-14 DIAGNOSIS — K644 Residual hemorrhoidal skin tags: Secondary | ICD-10-CM | POA: Diagnosis not present

## 2023-09-14 DIAGNOSIS — K645 Perianal venous thrombosis: Secondary | ICD-10-CM

## 2023-09-14 DIAGNOSIS — K649 Unspecified hemorrhoids: Secondary | ICD-10-CM | POA: Diagnosis present

## 2023-09-14 MED ORDER — HYDROCORTISONE (PERIANAL) 2.5 % EX CREA: 1.0000 | TOPICAL_CREAM | Freq: Two times a day (BID) | CUTANEOUS | 0 refills | Status: AC

## 2023-09-14 MED ORDER — LIDOCAINE-EPINEPHRINE (PF) 2 %-1:200000 IJ SOLN
10.0000 mL | Freq: Once | INTRAMUSCULAR | Status: AC
  Administered 2023-09-14: 10 mL
  Filled 2023-09-14: qty 20

## 2023-09-14 NOTE — ED Triage Notes (Signed)
Pt c/o hemorrhoid x3 days. Ibuprofen for pain, "it didn't help." LBM yesterday, normal. No bleeding, "but it's swollen, so big."

## 2023-09-14 NOTE — ED Provider Notes (Signed)
[place] [facility] AT [facility] Provider Note   CSN: [id number] Arrival date & time: 09/14/23  2003     History {Add pertinent medical, surgical, social history, OB history to HPI:1} Chief Complaint  Patient presents with   Hemorrhoids    Carly Cabrera is a 37 y.o. female.  She is here with a swollen painful area around her anus that is been there for 2 days.  She said she had been having some hard stool earlier but it was soft.  She said she had hemorrhoids about 8 years ago when she was pregnant but has not been troubled with them since.  No difficulty urinating no fever.  No bleeding  The history is provided by the patient.       Home Medications Prior to Admission medications   Medication Sig Start Date End Date Taking? Authorizing Provider  benzonatate (TESSALON) 100 MG capsule Take 1 capsule (100 mg total) by mouth every 8 (eight) hours. 07/02/23   Small, Brooke L, PA  clarithromycin (BIAXIN) 500 MG tablet Take 500 mg by mouth 2 (two) times daily. 03/20/23   [provider]  doxycycline (VIBRAMYCIN) 100 MG capsule Take 100 mg by mouth 2 (two) times daily. 02/20/23   [provider]  ibuprofen (ADVIL) 800 MG tablet Take 1 tablet (800 mg total) by mouth 3 (three) times daily with meals as needed for headache, moderate pain or cramping. 01/17/23   Warden Fillers, MD  metroNIDAZOLE (FLAGYL) 500 MG tablet Take 500 mg by mouth 2 (two) times daily. 03/20/23   [provider]  omeprazole (PRILOSEC) 40 MG capsule Take 1 capsule (40 mg total) by mouth daily for 14 days. First thing in the morning, 20-30 minutes before eating 01/01/18 01/15/18  Shaune Pollack, MD  sucralfate (CARAFATE) 1 g tablet Take 1 tablet (1 g total) by mouth 4 (four) times daily -  with meals and at bedtime for 14 days. 01/01/18 01/15/18  Shaune Pollack, MD  tranexamic acid (LYSTEDA) 650 MG TABS tablet Take 2 tablets (1,300 mg total) by mouth 3 (three) times daily. Take  during menses for a maximum of five days 04/07/23   Warden Fillers, MD      Allergies    Patient has no known allergies.    Review of Systems   Review of Systems  Constitutional:  Negative for fever.  Gastrointestinal:  Positive for rectal pain. Negative for nausea and vomiting.  Genitourinary:  Negative for dysuria.    Physical Exam Updated Vital Signs BP 123/76   Pulse 65   Temp 98.4 F (36.9 C)   Resp 16   SpO2 100%  Physical Exam Constitutional:      Appearance: Normal appearance. She is well-developed.  HENT:     Head: Normocephalic and atraumatic.  Eyes:     Conjunctiva/sclera: Conjunctivae normal.  Abdominal:     Tenderness: There is no abdominal tenderness. There is no guarding or rebound.  Genitourinary:    Comments: She has approximately centimeter and a half tender mass at her anus consistent with a thrombosed hemorrhoid. Musculoskeletal:     Cervical back: Neck supple.  Skin:    General: Skin is warm and dry.  Neurological:     Mental Status: She is alert.     GCS: GCS eye subscore is 4. GCS verbal subscore is 5. GCS motor subscore is 6.     ED Results / Procedures / Treatments   Labs (all labs ordered are listed,  but only abnormal results are displayed) Labs Reviewed - No data to display  EKG None  Radiology No results found.  Procedures .Marland KitchenIncision and Drainage  Date/Time: 09/14/2023 8:50 PM  Performed by: Terrilee Files, MD Authorized by: Terrilee Files, MD   Consent:    Consent obtained:  Verbal   Consent given by:  Patient   Risks discussed:  Bleeding, incomplete drainage, pain and infection   Alternatives discussed:  No treatment, delayed treatment and referral Universal protocol:    Procedure explained and questions answered to patient or proxy's satisfaction: yes     Patient identity confirmed:  Verbally with patient Location:    Type:  External thrombosed hemorrhoid   Size:  2   Location:  Anogenital   Anogenital  location:  Rectum Pre-procedure details:    Skin preparation:  Povidone-iodine Sedation:    Sedation type:  None Anesthesia:    Anesthesia method:  Local infiltration   Local anesthetic:  Lidocaine 2% WITH epi Procedure type:    Complexity:  Simple Procedure details:    Incision types:  Single straight   Wound management:  Probed and deloculated   Drainage characteristics: clot.   Drainage amount:  Scant   Wound treatment:  Wound left open Post-procedure details:    Procedure completion:  Tolerated well, no immediate complications   {Document cardiac monitor, telemetry assessment procedure when appropriate:1}  Medications Ordered in ED Medications  lidocaine-EPINEPHrine (XYLOCAINE W/EPI) 2 %-1:200000 (PF) injection 10 mL (has no administration in time range)    ED Course/ Medical Decision Making/ A&P   {   Click here for ABCD2, HEART and other calculatorsREFRESH Note before signing :1}                              Medical Decision Making Risk Prescription drug management.   This patient complains of ***; this involves an extensive number of treatment Options and is a complaint that carries with it a high risk of complications and morbidity. The differential includes ***  I ordered, reviewed and interpreted labs, which included *** I ordered medication *** and reviewed PMP when indicated. I ordered imaging studies which included *** and I independently    visualized and interpreted imaging which showed *** Additional history obtained from *** Previous records obtained and reviewed *** I consulted *** and discussed lab and imaging findings and discussed disposition.  Cardiac monitoring reviewed, *** Social determinants considered, *** Critical Interventions: ***  After the interventions stated above, I reevaluated the patient and found *** Admission and further testing considered, ***   {Document critical care time when appropriate:1} {Document review of labs and  clinical decision tools ie heart score, Chads2Vasc2 etc:1}  {Document your independent review of radiology images, and any outside records:1} {Document your discussion with family members, caretakers, and with consultants:1} {Document social determinants of health affecting pt's care:1} {Document your decision making why or why not admission, treatments were needed:1} Final Clinical Impression(s) / ED Diagnoses Final diagnoses:  None    Rx / DC Orders ED Discharge Orders     None

## 2023-09-14 NOTE — Discharge Instructions (Signed)
You were seen in the emergency department for painful lump near your rectum.  We incised this area and remove some clot.  Please keep the area clean with soap and water.  We are prescribing you some cream to use to decrease the inflammation.  You may have a little bit of bleeding that should resolve in the next day or so.  Follow-up with your regular doctor.  Return to the emergency department if any worsening or concerning symptoms

## 2023-09-14 NOTE — ED Notes (Signed)

## 2024-03-15 ENCOUNTER — Other Ambulatory Visit: Payer: Self-pay

## 2024-03-15 ENCOUNTER — Emergency Department (HOSPITAL_BASED_OUTPATIENT_CLINIC_OR_DEPARTMENT_OTHER)
Admission: EM | Admit: 2024-03-15 | Discharge: 2024-03-15 | Disposition: A | Discharge status: Home / Self Care | Attending: Emergency Medicine | Admitting: Emergency Medicine

## 2024-03-15 ENCOUNTER — Encounter (HOSPITAL_BASED_OUTPATIENT_CLINIC_OR_DEPARTMENT_OTHER): Payer: Self-pay

## 2024-03-15 ENCOUNTER — Emergency Department (HOSPITAL_BASED_OUTPATIENT_CLINIC_OR_DEPARTMENT_OTHER)

## 2024-03-15 DIAGNOSIS — S39012D Strain of muscle, fascia and tendon of lower back, subsequent encounter: Secondary | ICD-10-CM | POA: Diagnosis not present

## 2024-03-15 DIAGNOSIS — I10 Essential (primary) hypertension: Secondary | ICD-10-CM | POA: Diagnosis not present

## 2024-03-15 DIAGNOSIS — W000XXD Fall on same level due to ice and snow, subsequent encounter: Secondary | ICD-10-CM | POA: Diagnosis not present

## 2024-03-15 DIAGNOSIS — M545 Low back pain, unspecified: Secondary | ICD-10-CM | POA: Diagnosis present

## 2024-03-15 LAB — PREGNANCY, URINE: Preg Test, Ur: NEGATIVE

## 2024-03-15 MED ORDER — DEXAMETHASONE SODIUM PHOSPHATE 10 MG/ML IJ SOLN
10.0000 mg | Freq: Once | INTRAMUSCULAR | Status: AC
  Administered 2024-03-15: 10 mg via INTRAMUSCULAR
  Filled 2024-03-15: qty 1

## 2024-03-15 MED ORDER — MELOXICAM 15 MG PO TABS: 15.0000 mg | ORAL_TABLET | Freq: Every day | ORAL | 0 refills | Status: AC

## 2024-03-15 MED ORDER — KETOROLAC TROMETHAMINE 30 MG/ML IJ SOLN
30.0000 mg | Freq: Once | INTRAMUSCULAR | Status: AC
  Administered 2024-03-15: 30 mg via INTRAMUSCULAR
  Filled 2024-03-15: qty 1

## 2024-03-15 MED ORDER — METHOCARBAMOL 500 MG PO TABS: 500.0000 mg | ORAL_TABLET | Freq: Three times a day (TID) | ORAL | 0 refills | Status: AC | PRN

## 2024-03-15 MED ORDER — DICLOFENAC SODIUM 1 % EX GEL: 2.0000 g | Freq: Four times a day (QID) | CUTANEOUS | 0 refills | Status: AC | PRN

## 2024-03-15 NOTE — ED Triage Notes (Signed)
 Lower back pain since January after fall in snow. States was given muscle relaxer by PCP. States she is no longer getting relief from at home prescriptions. Pain worsened last night, pain worse w movement/twisting  Pt ambulatory to ED room.   Denies urinary symptoms

## 2024-03-15 NOTE — ED Provider Notes (Signed)
 Emergency Department Provider Note   I have reviewed the triage vital signs and the nursing notes.   HISTORY  Chief Complaint Back Pain   HPI Carly Cabrera is a 38 y.o. female with past history reviewed below including hypertension presents emergency department with low back pain radiating to the left leg.  Patient had initial fall in January after slipping on some ice.  Since that time she has had increased low back discomfort, occasionally radiating to the left lower quadrant and down the left leg.  No numbness but occasional tingling.  No bowel or bladder incontinence or retention.  No groin numbness.  No fevers. No new injuries.  She has been taking muscle relaxer but recently ran out and pain is returned.    Past Medical History:  Diagnosis Date   Anemia    Gestational diabetes    Gestational diabetes mellitus in pregnancy    Hypertension     Review of Systems  Constitutional: No fever/chills Cardiovascular: Denies chest pain. Respiratory: Denies shortness of breath. Gastrointestinal: No abdominal pain.  No nausea, no vomiting. Musculoskeletal: Positive back pain.  Skin: Negative for rash. Neurological: Negative for headaches. Negative numbness/weakness.    ____________________________________________   PHYSICAL EXAM:  VITAL SIGNS: ED Triage Vitals  Encounter Vitals Group     BP 03/15/24 0810 122/81     Pulse Rate 03/15/24 0810 92     Resp 03/15/24 0810 18     Temp 03/15/24 0810 98.8 F (37.1 C)     Temp Source 03/15/24 0810 Oral     SpO2 03/15/24 0810 100 %     Weight 03/15/24 0811 208 lb (94.3 kg)     Height 03/15/24 0811 5\' 8"  (1.727 m)   Constitutional: Alert and oriented. Well appearing and in no acute distress. Eyes: Conjunctivae are normal.  Head: Atraumatic. Nose: No congestion/rhinnorhea. Mouth/Throat: Mucous membranes are moist.  Neck: No stridor.   Cardiovascular: Normal rate, regular rhythm. Good peripheral circulation. Grossly normal  heart sounds.   Respiratory: Normal respiratory effort.  No retractions. Lungs CTAB. Gastrointestinal: No distention.  Musculoskeletal: No midline lumbar spine tenderness. Mild paraspinal tenderness to the lumbar spine.  Neurologic:  Normal speech and language. No gross focal neurologic deficits are appreciated.  Skin:  Skin is warm, dry and intact. No rash noted.  ____________________________________________   LABS (all labs ordered are listed, but only abnormal results are displayed)  Labs Reviewed  PREGNANCY, URINE   ____________________________________________  EKG  *** ____________________________________________  RADIOLOGY  No results found.  ____________________________________________   PROCEDURES  Procedure(s) performed:   Procedures   ____________________________________________   INITIAL IMPRESSION / ASSESSMENT AND PLAN / ED COURSE  Pertinent labs & imaging results that were available during my care of the patient were reviewed by me and considered in my medical decision making (see chart for details).   This patient is Presenting for Evaluation of back pain, which does require a range of treatment options, and is a complaint that involves a high risk of morbidity and mortality.  The Differential Diagnoses includes but is not exclusive to musculoskeletal back pain, renal colic, urinary tract infection, pyelonephritis, intra-abdominal causes of back pain, aortic aneurysm or dissection, cauda equina syndrome, sciatica, lumbar disc disease, thoracic disc disease, etc.  I decided to review pertinent External Data, and in summary lumbar spine XR on 02/16/24 interpretation is negative. Cannot review images independently.    Clinical Laboratory Tests Ordered, included   Radiologic Tests Ordered, included lumbar spine XR. I  independently interpreted the images and agree with radiology interpretation.   Cardiac Monitor Tracing which shows NSR.    Social  Determinants of Health Risk patient is a non-smoker.   Consult complete with  Medical Decision Making: Summary:  Patient presents emergency department for evaluation of low back pain.  No red flag signs or symptoms to prompt emergent spine imaging with MRI.  Plan for repeat plain films and likely continued course of anti-inflammatories with MSK relaxers as needed and spine surgery follow-up information.  Reevaluation with update and discussion with   ***Considered admission***  Patient's presentation is most consistent with {EM COPA:27473}   Disposition:   ____________________________________________  FINAL CLINICAL IMPRESSION(S) / ED DIAGNOSES  Final diagnoses:  None     NEW OUTPATIENT MEDICATIONS STARTED DURING THIS VISIT:  New Prescriptions   No medications on file    Note:  This document was prepared using Dragon voice recognition software and may include unintentional dictation errors.  Alona Bene, MD, [facility]

## 2024-03-15 NOTE — ED Notes (Signed)
 Pt given water, Per EDP verbal order

## 2024-03-15 NOTE — Discharge Instructions (Signed)
 You have been seen in the Emergency Department (ED)  today for back pain.  Your workup and exam have not shown any acute abnormalities and you are likely suffering from muscle strain or possible problems with your discs, but there is no treatment that will fix your symptoms at this time.    Robaxin is a muscle relaxer and can cause drowsiness.  Do not drive a car or other important tasks while taking this medicine.  Meloxicam as an anti-inflammatory.  It is similar to ibuprofen, aspirin, naproxen.  Do not take these other over-the-counter medicines while on meloxicam.  It is okay to take Tylenol.    Please follow up with your doctor as soon as possible regarding today's ED visit and your back pain.  Return to the ED for worsening back pain, fever, weakness or numbness of either leg, or if you develop either (1) an inability to urinate or have bowel movements, or (2) loss of your ability to control your bathroom functions (if you start having "accidents"), or if you develop other new symptoms that concern you.

## 2024-05-28 DIAGNOSIS — G8929 Other chronic pain: Secondary | ICD-10-CM | POA: Insufficient documentation

## 2024-05-31 ENCOUNTER — Encounter (HOSPITAL_BASED_OUTPATIENT_CLINIC_OR_DEPARTMENT_OTHER): Payer: Self-pay

## 2024-05-31 ENCOUNTER — Other Ambulatory Visit: Payer: Self-pay

## 2024-05-31 DIAGNOSIS — R079 Chest pain, unspecified: Secondary | ICD-10-CM | POA: Diagnosis present

## 2024-05-31 DIAGNOSIS — D649 Anemia, unspecified: Secondary | ICD-10-CM | POA: Diagnosis not present

## 2024-05-31 DIAGNOSIS — R0789 Other chest pain: Secondary | ICD-10-CM | POA: Diagnosis not present

## 2024-05-31 DIAGNOSIS — R519 Headache, unspecified: Secondary | ICD-10-CM | POA: Insufficient documentation

## 2024-05-31 NOTE — ED Triage Notes (Signed)
 Chest pain on both sides since yesterday, right side is worse than the left and pain radiates into ribs. Headache that started today. Slight sob, denies nausea.

## 2024-06-01 ENCOUNTER — Other Ambulatory Visit: Payer: Self-pay

## 2024-06-01 ENCOUNTER — Emergency Department (HOSPITAL_BASED_OUTPATIENT_CLINIC_OR_DEPARTMENT_OTHER)
Admission: EM | Admit: 2024-06-01 | Discharge: 2024-06-01 | Disposition: A | Discharge status: Home / Self Care | Attending: Emergency Medicine | Admitting: Emergency Medicine

## 2024-06-01 ENCOUNTER — Emergency Department (HOSPITAL_BASED_OUTPATIENT_CLINIC_OR_DEPARTMENT_OTHER)

## 2024-06-01 DIAGNOSIS — R0789 Other chest pain: Secondary | ICD-10-CM

## 2024-06-01 LAB — CBC
HCT: 34.3 % — ABNORMAL LOW (ref 36.0–46.0)
Hemoglobin: 11.4 g/dL — ABNORMAL LOW (ref 12.0–15.0)
MCH: 27.3 pg (ref 26.0–34.0)
MCHC: 33.2 g/dL (ref 30.0–36.0)
MCV: 82.1 fL (ref 80.0–100.0)
Platelets: 248 10*3/uL (ref 150–400)
RBC: 4.18 MIL/uL (ref 3.87–5.11)
RDW: 15.4 % (ref 11.5–15.5)
WBC: 4.6 10*3/uL (ref 4.0–10.5)
nRBC: 0 % (ref 0.0–0.2)

## 2024-06-01 LAB — TROPONIN T, HIGH SENSITIVITY: Troponin T High Sensitivity: <15 ng/L (ref <19)

## 2024-06-01 LAB — BASIC METABOLIC PANEL WITH GFR
Anion gap: 10 (ref 5–15)
BUN: 10 mg/dL (ref 6–20)
CO2: 23 mmol/L (ref 22–32)
Calcium: 8.7 mg/dL — ABNORMAL LOW (ref 8.9–10.3)
Chloride: 105 mmol/L (ref 98–111)
Creatinine, Ser: 0.71 mg/dL (ref 0.44–1.00)
GFR, Estimated: >60 mL/min (ref >60)
Glucose, Bld: 117 mg/dL — ABNORMAL HIGH (ref 70–99)
Potassium: 3.8 mmol/L (ref 3.5–5.1)
Sodium: 138 mmol/L (ref 135–145)

## 2024-06-01 MED ORDER — KETOROLAC TROMETHAMINE 60 MG/2ML IM SOLN
60.0000 mg | Freq: Once | INTRAMUSCULAR | Status: AC
  Administered 2024-06-01: 60 mg via INTRAMUSCULAR
  Filled 2024-06-01: qty 2

## 2024-06-01 NOTE — ED Provider Notes (Signed)
 [place] EM[facility] AT [facility] Provider Note   CSN: [id number] Arrival date & time: 05/31/24  2349     Patient presents with: Chest Pain   Carly Cabrera is a 38 y.o. female.   The history is provided by the patient.  Chest Pain Carly Cabrera is a 38 y.o. female who presents to the Emergency Department complaining of chest pain.  She presents the emergency department for evaluation of bilateral upper chest pain, right greater than left that started yesterday while she was driving.  Pain is sharp and worse with palpation, deep breaths.  She also reports a headache that started today as well as some difficulty breathing starting today.  No fever, cough, nausea, vomiting, leg swelling or pain.  She does have a history of prediabetes.  She does not take any oral contraceptives.  No history of DVT/PE.  Denies any chance of pregnancy.  No recent injuries.      Prior to Admission medications   Medication Sig Start Date End Date Taking? Authorizing Provider  benzonatate  (TESSALON ) 100 MG capsule Take 1 capsule (100 mg total) by mouth every 8 (eight) hours. 07/02/23   Small, Brooke L, PA  clarithromycin (BIAXIN) 500 MG tablet Take 500 mg by mouth 2 (two) times daily. 03/20/23   [provider]  diclofenac  Sodium (VOLTAREN ) 1 % GEL Apply 2 g topically 4 (four) times daily as needed. 03/15/24   Long, Joshua G, MD  doxycycline (VIBRAMYCIN) 100 MG capsule Take 100 mg by mouth 2 (two) times daily. 02/20/23   [provider]  hydrocortisone  (ANUSOL -HC) 2.5 % rectal cream Place 1 Application rectally 2 (two) times daily. 09/14/23   Towana Ozell BROCKS, MD  ibuprofen  (ADVIL ) 800 MG tablet Take 1 tablet (800 mg total) by mouth 3 (three) times daily with meals as needed for headache, moderate pain or cramping. 01/17/23   Zina Jerilynn LABOR, MD  methocarbamol  (ROBAXIN ) 500 MG tablet Take 1 tablet (500 mg total) by mouth every 8 (eight) hours as needed. 03/15/24   Long,  Joshua G, MD  metroNIDAZOLE (FLAGYL) 500 MG tablet Take 500 mg by mouth 2 (two) times daily. 03/20/23   [provider]  omeprazole  (PRILOSEC) 40 MG capsule Take 1 capsule (40 mg total) by mouth daily for 14 days. First thing in the morning, 20-30 minutes before eating 01/01/18 01/15/18  Angelena Smalls, MD  sucralfate  (CARAFATE ) 1 g tablet Take 1 tablet (1 g total) by mouth 4 (four) times daily -  with meals and at bedtime for 14 days. 01/01/18 01/15/18  Angelena Smalls, MD  tranexamic acid  (LYSTEDA ) 650 MG TABS tablet Take 2 tablets (1,300 mg total) by mouth 3 (three) times daily. Take during menses for a maximum of five days 04/07/23   Zina Jerilynn LABOR, MD    Allergies: Patient has no known allergies.    Review of Systems  Cardiovascular:  Positive for chest pain.  All other systems reviewed and are negative.   Updated Vital Signs BP (!) 138/98 (BP Location: Left Arm)   Pulse 64   Temp 98.3 F (36.8 C)   Resp 16   Ht 5' 8 (1.727 m)   Wt 92.5 kg   SpO2 100%   BMI 31.02 kg/m   Physical Exam Vitals and nursing note reviewed.  Constitutional:      Appearance: She is well-developed.  HENT:     Head: Normocephalic and atraumatic.   Cardiovascular:     Rate and Rhythm: Normal rate  and regular rhythm.     Heart sounds: No murmur heard. Pulmonary:     Effort: Pulmonary effort is normal. No respiratory distress.     Breath sounds: Normal breath sounds.  Chest:     Chest wall: Tenderness present.  Abdominal:     Palpations: Abdomen is soft.     Tenderness: There is no abdominal tenderness. There is no guarding or rebound.   Musculoskeletal:        General: No swelling or tenderness.     Comments: 2+ DP pulses bilaterally   Skin:    General: Skin is warm and dry.   Neurological:     Mental Status: She is alert and oriented to person, place, and time.   Psychiatric:        Behavior: Behavior normal.     (all labs ordered are listed, but only abnormal results are  displayed) Labs Reviewed  BASIC METABOLIC PANEL WITH GFR - Abnormal; Notable for the following components:      Result Value   Glucose, Bld 117 (*)    Calcium 8.7 (*)    All other components within normal limits  CBC - Abnormal; Notable for the following components:   Hemoglobin 11.4 (*)    HCT 34.3 (*)    All other components within normal limits  PREGNANCY, URINE  TROPONIN T, HIGH SENSITIVITY  TROPONIN T, HIGH SENSITIVITY    EKG: EKG Interpretation Date/Time:  Tuesday June 01 2024 00:00:38 EDT Ventricular Rate:  68 PR Interval:  160 QRS Duration:  86 QT Interval:  384 QTC Calculation: 408 R Axis:   12  Text Interpretation: Normal sinus rhythm Nonspecific T wave abnormality Abnormal ECG Confirmed by Griselda Norris [phone]) on 06/01/2024 2:09:35 AM  Radiology: DG Chest 2 View Result Date: 06/01/2024 CLINICAL DATA:  Chest pain EXAM: CHEST - 2 VIEW COMPARISON:  Chest x-ray 08/17/2022 FINDINGS: The heart size and mediastinal contours are within normal limits. Both lungs are clear. The visualized skeletal structures are unremarkable. IMPRESSION: No active cardiopulmonary disease. Electronically Signed   By: Greig Pique M.D.   On: 06/01/2024 00:21     Procedures   Medications Ordered in the ED  ketorolac  (TORADOL ) injection 60 mg (60 mg Intramuscular Given 06/01/24 0235)                                    Medical Decision Making Amount and/or Complexity of Data Reviewed Labs: ordered. Radiology: ordered.  Risk Prescription drug management.   Patient here for evaluation of chest pain for the last day.  This is reproducible on examination.  EKG is nonischemic and similar compared to priors.  Chest x-ray is negative for acute infiltrate.  Troponin is negative.  She has a mild anemia, improved when compared to priors.  Current clinical picture is not consistent with ACS, dissection.  Doubt PE, PERC negative.  Suspect this is musculoskeletal chest pain.  She was treated with  ketorolac  in the emergency department.  Discussed home care for chest wall pain with OTC analgesics.  Discussed outpatient follow-up as well as return precautions.     Final diagnoses:  Chest wall pain    ED Discharge Orders     None          Griselda Norris, MD 06/01/24 [phone]

## 2024-06-10 ENCOUNTER — Encounter: Payer: Self-pay | Admitting: Obstetrics and Gynecology

## 2024-06-10 ENCOUNTER — Ambulatory Visit: Admitting: Obstetrics and Gynecology

## 2024-06-10 ENCOUNTER — Other Ambulatory Visit: Payer: Self-pay

## 2024-06-10 VITALS — BP 92/66 | HR 75 | Wt 207.0 lb

## 2024-06-10 DIAGNOSIS — Z1331 Encounter for screening for depression: Secondary | ICD-10-CM | POA: Diagnosis not present

## 2024-06-10 DIAGNOSIS — N920 Excessive and frequent menstruation with regular cycle: Secondary | ICD-10-CM | POA: Diagnosis not present

## 2024-06-10 MED ORDER — NORETHINDRONE ACETATE 5 MG PO TABS: 5.0000 mg | ORAL_TABLET | Freq: Every day | ORAL | 6 refills | Status: AC

## 2024-06-10 NOTE — Progress Notes (Signed)
 GYNECOLOGY VISIT  Patient name: Carly Cabrera MRN [id number]  Date of birth: 20-Apr-1986 Chief Complaint:   Gynecologic Exam  History:  Carly Cabrera is a 38 y.o. [id number] being seen today for AUB follow up. Given TXA - stopped due to dizziness and HA. Bleeding 5-6 days, monthly, pain just prior to onset, heavy bleeding, no pain with penetration.    Discussed the use of AI scribe software for clinical note transcription with the patient, who gave verbal consent to proceed.  History of Present Illness Carly Cabrera is a 38 year old female with diabetes who presents with heavy menstrual bleeding.  She experiences heavy menstrual bleeding, particularly at night, which has persisted for several years. Her menstrual cycle begins with pain and light bleeding, followed by three days of heavy bleeding at night, and concludes with clotting and reduced bleeding for two days.  She has tried various treatments including tranexamic acid , which caused headaches and dizziness, and several forms of birth control, including pills, an intrauterine device, and an implant, all of which resulted in increased bleeding.  She has a history of anemia and is currently taking iron  pills daily. She has previously required iron  infusions due to very low iron  levels.  She has five children and does not wish to have more in the future. She is currently starting a new job and is in training. She is divorced and has two autistic sons, which she describes as a 'big responsibility'. She is currently in a temporary position at her new job, with the potential for permanent employment after ninety days.  No history of migraines, blood clots, breast cancer, liver problems, stroke, or heart issues.    Past Medical History:  Diagnosis Date   Anemia    Chronic midline back pain 05/28/2024   Gestational diabetes    Gestational diabetes mellitus in pregnancy    Hypertension    Vitamin D insufficiency 12/10/2017     Past Surgical History:  Procedure Laterality Date   NO PAST SURGERIES      The following portions of the patient's history were reviewed and updated as appropriate: allergies, current medications, past family history, past medical history, past social history, past surgical history and problem list.   Health Maintenance:   Last pap 06/2022 NILM Last mammogram: n/a   Review of Systems:  Pertinent items are noted in HPI. Comprehensive review of systems was otherwise negative.   Objective:  Physical Exam BP 92/66   Pulse 75   Wt 207 lb (93.9 kg)   LMP 05/30/2024 (Exact Date)   BMI 31.47 kg/m    Physical Exam Vitals and nursing note reviewed.  Constitutional:      Appearance: Normal appearance.  HENT:     Head: Normocephalic and atraumatic.  Pulmonary:     Effort: Pulmonary effort is normal.  Skin:    General: Skin is warm and dry.  Neurological:     General: No focal deficit present.     Mental Status: She is alert.  Psychiatric:        Mood and Affect: Mood normal.        Behavior: Behavior normal.        Thought Content: Thought content normal.        Judgment: Judgment normal.        Assessment & Plan:   Assessment & Plan Menorrhagia Chronic heavy menstrual bleeding with nocturnal pain and clotting. Previous treatments ineffective or poorly tolerated. Not interested in future childbearing. Prefers medication  first, then endometrial ablation due to job constraints. Informed about contraception post-ablation and option for tubal ligation. - Prescribe aygestin  for bleeding control, to be taken daily. - Plan for endometrial ablation once she is ready and able to take time off work. - Discuss potential for tubal ligation at the time of ablation if desired.  Iron  Deficiency Anemia Anemia secondary to chronic menorrhagia. Managed with daily iron  supplementation. - Continue daily iron  supplementation.  Ho CHC contraindications: No history of migraines, blood  clots, breast cancer, liver problems, stroke, or heart disease.  Follow-up Plans to follow up for endometrial ablation once job situation stabilizes. - Contact the office to schedule endometrial ablation when ready.   Routine preventative health maintenance measures emphasized.  Carter Quarry, MD [facility], [facility]

## 2024-08-12 ENCOUNTER — Encounter (HOSPITAL_BASED_OUTPATIENT_CLINIC_OR_DEPARTMENT_OTHER): Payer: Self-pay

## 2024-08-12 ENCOUNTER — Emergency Department (HOSPITAL_BASED_OUTPATIENT_CLINIC_OR_DEPARTMENT_OTHER)

## 2024-08-12 ENCOUNTER — Emergency Department (HOSPITAL_BASED_OUTPATIENT_CLINIC_OR_DEPARTMENT_OTHER)
Admission: EM | Admit: 2024-08-12 | Discharge: 2024-08-12 | Disposition: A | Discharge status: Home / Self Care | Attending: Emergency Medicine | Admitting: Emergency Medicine

## 2024-08-12 ENCOUNTER — Other Ambulatory Visit: Payer: Self-pay

## 2024-08-12 ENCOUNTER — Other Ambulatory Visit (HOSPITAL_BASED_OUTPATIENT_CLINIC_OR_DEPARTMENT_OTHER): Payer: Self-pay

## 2024-08-12 DIAGNOSIS — R0789 Other chest pain: Secondary | ICD-10-CM | POA: Diagnosis present

## 2024-08-12 DIAGNOSIS — Z79899 Other long term (current) drug therapy: Secondary | ICD-10-CM | POA: Insufficient documentation

## 2024-08-12 DIAGNOSIS — I10 Essential (primary) hypertension: Secondary | ICD-10-CM | POA: Insufficient documentation

## 2024-08-12 DIAGNOSIS — R079 Chest pain, unspecified: Secondary | ICD-10-CM

## 2024-08-12 LAB — CBC WITH DIFFERENTIAL/PLATELET
Abs Immature Granulocytes: 0.01 K/uL (ref 0.00–0.07)
Basophils Absolute: 0.1 K/uL (ref 0.0–0.1)
Basophils Relative: 1 %
Eosinophils Absolute: 0 K/uL (ref 0.0–0.5)
Eosinophils Relative: 0 %
HCT: 39.2 % (ref 36.0–46.0)
Hemoglobin: 13.1 g/dL (ref 12.0–15.0)
Immature Granulocytes: 0 %
Lymphocytes Relative: 38 %
Lymphs Abs: 2.4 K/uL (ref 0.7–4.0)
MCH: 28.2 pg (ref 26.0–34.0)
MCHC: 33.4 g/dL (ref 30.0–36.0)
MCV: 84.5 fL (ref 80.0–100.0)
Monocytes Absolute: 0.4 K/uL (ref 0.1–1.0)
Monocytes Relative: 7 %
Neutro Abs: 3.3 K/uL (ref 1.7–7.7)
Neutrophils Relative %: 54 %
Platelets: 305 K/uL (ref 150–400)
RBC: 4.64 MIL/uL (ref 3.87–5.11)
RDW: 13.5 % (ref 11.5–15.5)
WBC: 6.1 K/uL (ref 4.0–10.5)
nRBC: 0 % (ref 0.0–0.2)

## 2024-08-12 LAB — TROPONIN T, HIGH SENSITIVITY: Troponin T High Sensitivity: <15 ng/L (ref 0–19)

## 2024-08-12 LAB — COMPREHENSIVE METABOLIC PANEL WITH GFR
ALT: 12 U/L (ref 0–44)
AST: 17 U/L (ref 15–41)
Albumin: 4.4 g/dL (ref 3.5–5.0)
Alkaline Phosphatase: 56 U/L (ref 38–126)
Anion gap: 11 (ref 5–15)
BUN: 10 mg/dL (ref 6–20)
CO2: 23 mmol/L (ref 22–32)
Calcium: 9 mg/dL (ref 8.9–10.3)
Chloride: 106 mmol/L (ref 98–111)
Creatinine, Ser: 0.77 mg/dL (ref 0.44–1.00)
GFR, Estimated: >60 mL/min (ref >60)
Glucose, Bld: 86 mg/dL (ref 70–99)
Potassium: 3.4 mmol/L — ABNORMAL LOW (ref 3.5–5.1)
Sodium: 140 mmol/L (ref 135–145)
Total Bilirubin: 0.4 mg/dL (ref 0.0–1.2)
Total Protein: 7.9 g/dL (ref 6.5–8.1)

## 2024-08-12 LAB — D-DIMER, QUANTITATIVE: D-Dimer, Quant: <0.27 ug{FEU}/mL (ref 0.00–0.50)

## 2024-08-12 LAB — LIPASE, BLOOD: Lipase: 67 U/L — ABNORMAL HIGH (ref 11–51)

## 2024-08-12 MED ORDER — CYCLOBENZAPRINE HCL 10 MG PO TABS
10.0000 mg | ORAL_TABLET | Freq: Two times a day (BID) | ORAL | 0 refills | Status: AC | PRN
  Filled 2024-08-12: qty 20, 10d supply, fill #0

## 2024-08-12 MED ORDER — ACETAMINOPHEN 500 MG PO TABS
1000.0000 mg | ORAL_TABLET | Freq: Once | ORAL | Status: AC
  Administered 2024-08-12: 1000 mg via ORAL
  Filled 2024-08-12: qty 2

## 2024-08-12 NOTE — Discharge Instructions (Addendum)
 Recommend 1000 mg of Tylenol  every 6 hours as needed for pain, recommend 400 mg ibuprofen  every 8 hours as needed for pain.  I have written you prescription for Flexeril  which is a muscle relaxant.  This medication is sedating so please be careful with its use.

## 2024-08-12 NOTE — ED Provider Notes (Signed)
  EMERGENCY DEPARTMENT AT [facility] Provider Note   CSN: [id number] Arrival date & time: 08/12/24  9262     Patient presents with: Chest Pain   Carly Cabrera is a 38 y.o. female.   Patient here to pain shortness of breath intermittent last few days.  Seems to be having pain in the right shoulder right chest wall seems worse with movement.  She states she is on some sort of birth control but she has remember the name of the medicine.  She denies any recent surgery or travel.  No blood clot history.  Movement makes it worse.  Being still makes it better.  She does have chronic back pain she states.  She denies any smoking history.  No fever chills or cough or sputum production.  The history is provided by the patient.       Prior to Admission medications   Medication Sig Start Date End Date Taking? Authorizing Provider  cyclobenzaprine  (FLEXERIL ) 10 MG tablet Take 1 tablet (10 mg total) by mouth 2 (two) times daily as needed for muscle spasms. 08/12/24  Yes Seferina Brokaw, DO  benzonatate  (TESSALON ) 100 MG capsule Take 1 capsule (100 mg total) by mouth every 8 (eight) hours. 07/02/23   Small, Brooke L, PA  cetirizine (ZYRTEC) 10 MG tablet Take 10 mg by mouth daily. 03/18/24 03/18/25  [provider]  clarithromycin (BIAXIN) 500 MG tablet Take 500 mg by mouth 2 (two) times daily. 03/20/23   [provider]  diclofenac  Sodium (VOLTAREN ) 1 % GEL Apply 2 g topically 4 (four) times daily as needed. Patient not taking: Reported on 06/10/2024 03/15/24   Long, Joshua G, MD  doxycycline (VIBRAMYCIN) 100 MG capsule Take 100 mg by mouth 2 (two) times daily. 02/20/23   [provider]  hydrocortisone  (ANUSOL -HC) 2.5 % rectal cream Place 1 Application rectally 2 (two) times daily. Patient not taking: Reported on 06/10/2024 09/14/23   Towana Ozell BROCKS, MD  ibuprofen  (ADVIL ) 800 MG tablet Take 1 tablet (800 mg total) by mouth 3 (three) times daily with meals as  needed for headache, moderate pain or cramping. 01/17/23   Zina Jerilynn LABOR, MD  methocarbamol  (ROBAXIN ) 500 MG tablet Take 1 tablet (500 mg total) by mouth every 8 (eight) hours as needed. 03/15/24   Long, Joshua G, MD  metroNIDAZOLE (FLAGYL) 500 MG tablet Take 500 mg by mouth 2 (two) times daily. Patient not taking: Reported on 06/10/2024 03/20/23   [provider]  naproxen  (NAPROSYN ) 500 MG tablet Take 500 mg by mouth 2 (two) times daily with a meal. 05/14/24   [provider]  norethindrone  (AYGESTIN ) 5 MG tablet Take 1 tablet (5 mg total) by mouth daily. 06/10/24   Ajewole, Christana, MD  omeprazole  (PRILOSEC) 40 MG capsule Take 1 capsule (40 mg total) by mouth daily for 14 days. First thing in the morning, 20-30 minutes before eating Patient not taking: Reported on 06/10/2024 01/01/18 01/15/18  Angelena Smalls, MD  sucralfate  (CARAFATE ) 1 g tablet Take 1 tablet (1 g total) by mouth 4 (four) times daily -  with meals and at bedtime for 14 days. Patient not taking: Reported on 06/10/2024 01/01/18 01/15/18  Angelena Smalls, MD    Allergies: Patient has no known allergies.    Review of Systems  Updated Vital Signs BP (!) 121/94 (BP Location: Right Arm)   Pulse 72   Temp 98.7 F (37.1 C) (Oral)   Resp 16   Ht 5' 8 (1.727  m)   Wt 94.3 kg   LMP 07/16/2024 (Approximate)   SpO2 100%   BMI 31.63 kg/m   Physical Exam Vitals and nursing note reviewed.  Constitutional:      General: She is not in acute distress.    Appearance: She is well-developed. She is not ill-appearing.  HENT:     Head: Normocephalic and atraumatic.  Eyes:     Extraocular Movements: Extraocular movements intact.     Conjunctiva/sclera: Conjunctivae normal.     Pupils: Pupils are equal, round, and reactive to light.  Cardiovascular:     Rate and Rhythm: Normal rate and regular rhythm.     Pulses:          Radial pulses are 2+ on the right side and 2+ on the left side.     Heart sounds: Normal heart sounds. No  murmur heard. Pulmonary:     Effort: Pulmonary effort is normal. No respiratory distress.     Breath sounds: Normal breath sounds.  Chest:     Chest wall: Tenderness present.  Abdominal:     Palpations: Abdomen is soft.     Tenderness: There is no abdominal tenderness.  Musculoskeletal:        General: No swelling.     Cervical back: Normal range of motion and neck supple.  Skin:    General: Skin is warm and dry.     Capillary Refill: Capillary refill takes less than 2 seconds.  Neurological:     Mental Status: She is alert.  Psychiatric:        Mood and Affect: Mood normal.     (all labs ordered are listed, but only abnormal results are displayed) Labs Reviewed  COMPREHENSIVE METABOLIC PANEL WITH GFR - Abnormal; Notable for the following components:      Result Value   Potassium 3.4 (*)    All other components within normal limits  LIPASE, BLOOD - Abnormal; Notable for the following components:   Lipase 67 (*)    All other components within normal limits  CBC WITH DIFFERENTIAL/PLATELET  D-DIMER, QUANTITATIVE  TROPONIN T, HIGH SENSITIVITY    EKG: EKG Interpretation Date/Time:  Thursday August 12 2024 07:53:44 EDT Ventricular Rate:  63 PR Interval:  159 QRS Duration:  96 QT Interval:  372 QTC Calculation: 381 R Axis:   13  Text Interpretation: Sinus rhythm Confirmed by Ruthe Cornet [phone]) on 08/12/2024 7:54:49 AM  Radiology: DG Chest Portable 1 View Result Date: 08/12/2024 EXAM: 1 VIEW XRAY OF THE CHEST 08/12/2024 08:21:00 AM COMPARISON: PA and lateral radiographs of the chest dated 06/01/2024. CLINICAL HISTORY: PAIN. Intermittent cp, shob, dizziness for several days. FINDINGS: LUNGS AND PLEURA: No focal pulmonary opacity. No pulmonary edema. No pleural effusion. No pneumothorax. HEART AND MEDIASTINUM: No acute abnormality of the cardiac and mediastinal silhouettes. BONES AND SOFT TISSUES: No acute osseous abnormality. IMPRESSION: 1. No acute process.  Electronically signed by: Evalene Coho MD 08/12/2024 08:26 AM EDT RP Workstation: [id number]     Procedures   Medications Ordered in the ED  acetaminophen  (TYLENOL ) tablet 1,000 mg (1,000 mg Oral Given 08/12/24 0805)                HEART Score: 1                    Medical Decision Making Amount and/or Complexity of Data Reviewed Labs: ordered. Radiology: ordered.  Risk OTC drugs. Prescription drug management.   Carly Cabrera is here with chest pain.  History of hypertension and chronic back pain.  Normal vitals.  No fever.  EKG shows sinus rhythm.  No ischemic changes per my review and interpretation of EKG.  Differential diagnosis likely musculoskeletal process but will evaluate for ACS PE pneumonia infectious process.  Have no concern for dissection.  She is neurologically intact.  Seems less likely to be infectious process but will give Tylenol  check labs including troponin D-dimer chest x-ray.  Overall lab work per my review and interpretation is unremarkable.  Troponin normal.  D-dimer normal.  No significant leukocytosis anemia or electrolyte abnormality.  Chest x-ray shows no evidence of pneumonia or pneumothorax.  Overall I do think that this is muscular.  Have no concern for ACS.  Pain somewhat reproducible on exam.  Worse with movement.  Will prescribe her a muscle relaxant.  Continue Tylenol  and ibuprofen  at home.  Educated about side effects of muscle relaxant.  Told to follow-up with primary care.  Return if symptoms worsen.  This chart was dictated using voice recognition software.  Despite best efforts to proofread,  errors can occur which can change the documentation meaning.      Final diagnoses:  Nonspecific chest pain    ED Discharge Orders          Ordered    cyclobenzaprine  (FLEXERIL ) 10 MG tablet  2 times daily PRN        08/12/24 0905               Ruthe Cornet, DO 08/12/24 [phone]

## 2024-08-12 NOTE — ED Triage Notes (Signed)
 Pt presents with chest pain that has been intermittent for the past few days. Has intermittent SOB and dizziness as well with tingling in hands. CP is right sided and radiates to left side and arms. No radiation to jaw.
